# Patient Record
Sex: Male | Born: 1955 | Race: White | Hispanic: No | Marital: Married | Smoking: Current every day smoker
Health system: Southern US, Community
[De-identification: ages and names within clinical notes are randomized; demographics above are authoritative.]

## PROBLEM LIST (undated history)

## (undated) DIAGNOSIS — I10 Essential (primary) hypertension: Secondary | ICD-10-CM

## (undated) DIAGNOSIS — E7800 Pure hypercholesterolemia, unspecified: Secondary | ICD-10-CM

## (undated) DIAGNOSIS — E119 Type 2 diabetes mellitus without complications: Secondary | ICD-10-CM

## (undated) DIAGNOSIS — K219 Gastro-esophageal reflux disease without esophagitis: Secondary | ICD-10-CM

## (undated) DIAGNOSIS — K759 Inflammatory liver disease, unspecified: Secondary | ICD-10-CM

## (undated) DIAGNOSIS — E785 Hyperlipidemia, unspecified: Secondary | ICD-10-CM

## (undated) DIAGNOSIS — F419 Anxiety disorder, unspecified: Secondary | ICD-10-CM

## (undated) DIAGNOSIS — K7460 Unspecified cirrhosis of liver: Secondary | ICD-10-CM

## (undated) HISTORY — PX: EYE SURGERY: SHX253

## (undated) HISTORY — PX: ELBOW SURGERY: SHX618

---

## 2004-09-10 ENCOUNTER — Ambulatory Visit: Payer: Self-pay | Admitting: Family Medicine

## 2006-04-09 ENCOUNTER — Emergency Department: Payer: Self-pay | Admitting: Emergency Medicine

## 2011-11-04 DIAGNOSIS — Z720 Tobacco use: Secondary | ICD-10-CM | POA: Insufficient documentation

## 2011-11-04 DIAGNOSIS — R739 Hyperglycemia, unspecified: Secondary | ICD-10-CM | POA: Insufficient documentation

## 2011-11-04 DIAGNOSIS — B1920 Unspecified viral hepatitis C without hepatic coma: Secondary | ICD-10-CM | POA: Insufficient documentation

## 2011-11-04 DIAGNOSIS — H5440 Blindness, one eye, unspecified eye: Secondary | ICD-10-CM | POA: Insufficient documentation

## 2011-11-04 DIAGNOSIS — N529 Male erectile dysfunction, unspecified: Secondary | ICD-10-CM | POA: Insufficient documentation

## 2012-08-16 ENCOUNTER — Ambulatory Visit: Payer: Self-pay | Admitting: Gastroenterology

## 2012-08-16 LAB — CBC WITH DIFFERENTIAL/PLATELET
Basophil #: 0 10*3/uL (ref 0.0–0.1)
Basophil %: 0.7 %
Eosinophil %: 3.2 %
HGB: 14.6 g/dL (ref 13.0–18.0)
MCH: 33.3 pg (ref 26.0–34.0)
MCHC: 34.9 g/dL (ref 32.0–36.0)
Monocyte #: 0.6 x10 3/mm (ref 0.2–1.0)
Monocyte %: 11 %
Neutrophil %: 50.5 %
WBC: 5.9 10*3/uL (ref 3.8–10.6)

## 2012-08-16 LAB — HEPATIC FUNCTION PANEL A (ARMC)
Albumin: 4 g/dL (ref 3.4–5.0)
Alkaline Phosphatase: 90 U/L (ref 50–136)
Bilirubin, Direct: 0.1 mg/dL (ref 0.00–0.20)
SGOT(AST): 259 U/L — ABNORMAL HIGH (ref 15–37)
SGPT (ALT): 357 U/L — ABNORMAL HIGH (ref 12–78)

## 2012-10-19 ENCOUNTER — Ambulatory Visit: Payer: Self-pay | Admitting: Family Medicine

## 2013-03-26 ENCOUNTER — Emergency Department: Payer: Self-pay | Admitting: Emergency Medicine

## 2013-03-26 LAB — COMPREHENSIVE METABOLIC PANEL
ALK PHOS: 190 U/L — AB
ALT: 245 U/L — AB (ref 12–78)
ANION GAP: 13 (ref 7–16)
AST: 179 U/L — AB (ref 15–37)
Albumin: 3.5 g/dL (ref 3.4–5.0)
BUN: 18 mg/dL (ref 7–18)
Bilirubin,Total: 0.6 mg/dL (ref 0.2–1.0)
CREATININE: 0.93 mg/dL (ref 0.60–1.30)
Calcium, Total: 9.5 mg/dL (ref 8.5–10.1)
Chloride: 94 mmol/L — ABNORMAL LOW (ref 98–107)
Co2: 23 mmol/L (ref 21–32)
EGFR (Non-African Amer.): >60
GLUCOSE: 507 mg/dL — AB (ref 65–99)
Osmolality: 285 (ref 275–301)
Potassium: 4.6 mmol/L (ref 3.5–5.1)
Sodium: 130 mmol/L — ABNORMAL LOW (ref 136–145)
Total Protein: 8.6 g/dL — ABNORMAL HIGH (ref 6.4–8.2)

## 2013-03-26 LAB — CBC
HCT: 41.4 % (ref 40.0–52.0)
HGB: 14.8 g/dL (ref 13.0–18.0)
MCH: 33.7 pg (ref 26.0–34.0)
MCHC: 35.6 g/dL (ref 32.0–36.0)
MCV: 95 fL (ref 80–100)
Platelet: 99 10*3/uL — ABNORMAL LOW (ref 150–440)
RBC: 4.38 10*6/uL — AB (ref 4.40–5.90)
RDW: 12.4 % (ref 11.5–14.5)
WBC: 3.9 10*3/uL (ref 3.8–10.6)

## 2013-03-26 LAB — URINALYSIS, COMPLETE
BACTERIA: NONE SEEN
BILIRUBIN, UR: NEGATIVE
Blood: NEGATIVE
Glucose,UR: >=500 mg/dL (ref 0–75)
Leukocyte Esterase: NEGATIVE
Nitrite: NEGATIVE
Ph: 6 (ref 4.5–8.0)
Protein: NEGATIVE
RBC,UR: <1 /HPF (ref 0–5)
Specific Gravity: 1.025 (ref 1.003–1.030)
Squamous Epithelial: <1
WBC UR: <1 /HPF (ref 0–5)

## 2013-03-26 LAB — MAGNESIUM: MAGNESIUM: 1.8 mg/dL

## 2013-03-26 LAB — BETA-HYDROXYBUTYRIC ACID: Beta-Hydroxybutyrate: 19.5 mg/dL — ABNORMAL HIGH (ref 0.2–2.8)

## 2013-06-27 DIAGNOSIS — S62339A Displaced fracture of neck of unspecified metacarpal bone, initial encounter for closed fracture: Secondary | ICD-10-CM | POA: Insufficient documentation

## 2013-07-01 ENCOUNTER — Other Ambulatory Visit: Payer: Self-pay | Admitting: Urgent Care

## 2017-08-12 ENCOUNTER — Other Ambulatory Visit: Payer: Self-pay

## 2017-08-12 ENCOUNTER — Emergency Department
Admission: EM | Admit: 2017-08-12 | Discharge: 2017-08-13 | Disposition: A | Discharge status: Home / Self Care | Payer: Self-pay | Attending: Emergency Medicine | Admitting: Emergency Medicine

## 2017-08-12 ENCOUNTER — Encounter: Payer: Self-pay | Admitting: Emergency Medicine

## 2017-08-12 DIAGNOSIS — F17210 Nicotine dependence, cigarettes, uncomplicated: Secondary | ICD-10-CM | POA: Insufficient documentation

## 2017-08-12 DIAGNOSIS — Z9114 Patient's other noncompliance with medication regimen: Secondary | ICD-10-CM | POA: Insufficient documentation

## 2017-08-12 DIAGNOSIS — I10 Essential (primary) hypertension: Secondary | ICD-10-CM | POA: Insufficient documentation

## 2017-08-12 DIAGNOSIS — F419 Anxiety disorder, unspecified: Secondary | ICD-10-CM | POA: Insufficient documentation

## 2017-08-12 DIAGNOSIS — E1165 Type 2 diabetes mellitus with hyperglycemia: Secondary | ICD-10-CM | POA: Insufficient documentation

## 2017-08-12 DIAGNOSIS — Z9189 Other specified personal risk factors, not elsewhere classified: Secondary | ICD-10-CM

## 2017-08-12 HISTORY — DX: Pure hypercholesterolemia, unspecified: E78.00

## 2017-08-12 HISTORY — DX: Type 2 diabetes mellitus without complications: E11.9

## 2017-08-12 HISTORY — DX: Anxiety disorder, unspecified: F41.9

## 2017-08-12 HISTORY — DX: Essential (primary) hypertension: I10

## 2017-08-12 LAB — BASIC METABOLIC PANEL
ANION GAP: 11 (ref 5–15)
BUN: 34 mg/dL — ABNORMAL HIGH (ref 8–23)
CO2: 25 mmol/L (ref 22–32)
Calcium: 9.3 mg/dL (ref 8.9–10.3)
Chloride: 95 mmol/L — ABNORMAL LOW (ref 98–111)
Creatinine, Ser: 1.37 mg/dL — ABNORMAL HIGH (ref 0.61–1.24)
GFR calc Af Amer: >60 mL/min (ref >60)
GFR calc non Af Amer: 54 mL/min — ABNORMAL LOW (ref >60)
GLUCOSE: 418 mg/dL — AB (ref 70–99)
Potassium: 4.3 mmol/L (ref 3.5–5.1)
Sodium: 131 mmol/L — ABNORMAL LOW (ref 135–145)

## 2017-08-12 LAB — URINALYSIS, COMPLETE (UACMP) WITH MICROSCOPIC
BACTERIA UA: NONE SEEN
BILIRUBIN URINE: NEGATIVE
Glucose, UA: >=500 mg/dL — AB
KETONES UR: NEGATIVE mg/dL
Leukocytes, UA: NEGATIVE
Nitrite: NEGATIVE
PH: 5 (ref 5.0–8.0)
Protein, ur: NEGATIVE mg/dL
Specific Gravity, Urine: 1.024 (ref 1.005–1.030)

## 2017-08-12 LAB — BLOOD GAS, VENOUS
Acid-Base Excess: 1.7 mmol/L (ref 0.0–2.0)
Bicarbonate: 26.6 mmol/L (ref 20.0–28.0)
FIO2: 0.21
O2 Saturation: 84.6 %
PCO2 VEN: 42 mmHg — AB (ref 44.0–60.0)
PH VEN: 7.41 (ref 7.250–7.430)
Patient temperature: 37
pO2, Ven: 49 mmHg — ABNORMAL HIGH (ref 32.0–45.0)

## 2017-08-12 LAB — GLUCOSE, CAPILLARY
GLUCOSE-CAPILLARY: 366 mg/dL — AB (ref 70–99)
GLUCOSE-CAPILLARY: 379 mg/dL — AB (ref 70–99)
GLUCOSE-CAPILLARY: 338 mg/dL — AB (ref 70–99)
Glucose-Capillary: 405 mg/dL — ABNORMAL HIGH (ref 70–99)

## 2017-08-12 LAB — CBC
HEMATOCRIT: 40.8 % (ref 40.0–52.0)
HEMOGLOBIN: 14.5 g/dL (ref 13.0–18.0)
MCH: 33.5 pg (ref 26.0–34.0)
MCHC: 35.6 g/dL (ref 32.0–36.0)
MCV: 94.3 fL (ref 80.0–100.0)
Platelets: 112 10*3/uL — ABNORMAL LOW (ref 150–440)
RBC: 4.33 MIL/uL — ABNORMAL LOW (ref 4.40–5.90)
RDW: 13.4 % (ref 11.5–14.5)
WBC: 7 10*3/uL (ref 3.8–10.6)

## 2017-08-12 MED ORDER — METFORMIN HCL 500 MG PO TABS
1000.0000 mg | ORAL_TABLET | Freq: Once | ORAL | Status: AC
  Administered 2017-08-12: 1000 mg via ORAL
  Filled 2017-08-12: qty 2

## 2017-08-12 MED ORDER — SODIUM CHLORIDE 0.9 % IV BOLUS
1000.0000 mL | Freq: Once | INTRAVENOUS | Status: AC
  Administered 2017-08-12: 1000 mL via INTRAVENOUS

## 2017-08-12 MED ORDER — SODIUM CHLORIDE 0.9 % IV BOLUS
500.0000 mL | Freq: Once | INTRAVENOUS | Status: AC
  Administered 2017-08-12: 500 mL via INTRAVENOUS

## 2017-08-12 MED ORDER — INSULIN ASPART 100 UNIT/ML ~~LOC~~ SOLN
3.0000 [IU] | Freq: Once | SUBCUTANEOUS | Status: AC
  Administered 2017-08-12: 3 [IU] via SUBCUTANEOUS
  Filled 2017-08-12: qty 1

## 2017-08-12 NOTE — ED Notes (Signed)
Date and time results received: 08/12/17 2207  Test: Glucose Critical Value: 379 mg/dL  Name of Provider Notified: Dr. Jacqualine Code

## 2017-08-12 NOTE — ED Notes (Signed)
Pt ambulated to sub waiting area without difficulty; call bell in reach; visitor leaving at this time;

## 2017-08-12 NOTE — ED Notes (Signed)
Pt reports he has been out of his glipizide for about 2 weeks. Pt reports his blood sugar has been running a little high the last few days. Pt states he has been feeling a little weak. Pt denies other co's at this time.

## 2017-08-12 NOTE — ED Triage Notes (Signed)
C/O elevated blood sugar at home this afternoon.  States blood sugar has been elevated for "a couple of weeks".  States he has been feeling weak and having difficulty concentrating for a few weeks.  Also increased urination.

## 2017-08-12 NOTE — ED Provider Notes (Addendum)
[facility] Emergency Department Provider Note   ____________________________________________   First MD Initiated Contact with Patient 08/12/17 2140     (approximate)  I have reviewed the triage vital signs and the nursing notes.   HISTORY  Chief Complaint Hyperglycemia    HPI [name] is a 62 y.o. male history of diabetes hypertension  Patient reports for the last 2 weeks has been able to feel his blood sugars "high".  Reports that when he does he will occasionally take 1 of his metformin's.  He reports he does not take his medication very regularly, but takes it when he feels he needs it.  Is been feeling just slightly lightheaded, thirsty and urinating quite a bit the last 3 days.  He supposed to take metformin 1000 mg twice a day but reports he works third shift, does not remember to do it sometimes, and really like taking his medicine.  He also reports he was on glipizide, but stopped taking that a while ago because when he did take that it was causing his blood sugars to go too low.  No chest pain.  No fever chills.  No nausea vomiting.  He has not run out of his medication.  He has a primary care doctor.   Past Medical History:  Diagnosis Date  . Anxiety   . Diabetes mellitus without complication ([place])   . High cholesterol   . Hypertension     There are no active problems to display for this patient.   History reviewed. No pertinent surgical history.  Prior to Admission medications   Not on File    Allergies Patient has no known allergies.  No family history on file.  Social History Social History   Tobacco Use  . Smoking status: Current Every Day Smoker    Types: Cigarettes  . Smokeless tobacco: Never Used  Substance Use Topics  . Alcohol use: Not on file  . Drug use: Not on file  Denies illicit drug use  Review of Systems Constitutional: No fever/chills but is feeling a little lightheaded and thirsty Eyes: No  visual changes. ENT: No sore throat. Cardiovascular: Denies chest pain. Respiratory: Denies shortness of breath. Gastrointestinal: No abdominal pain.  No nausea, no vomiting.  No diarrhea.  No constipation. Genitourinary: Negative for dysuria.  Increased urination for the last 3 to 4 days. Musculoskeletal: Negative for back pain. Skin: Negative for rash. Neurological: Negative for headaches, focal weakness or numbness.    ____________________________________________   PHYSICAL EXAM:  VITAL SIGNS: ED Triage Vitals  Enc Vitals Group     BP 08/12/17 1647 128/75     Pulse Rate 08/12/17 1647 76     Resp 08/12/17 1647 16     Temp 08/12/17 1647 98.2 F (36.8 C)     Temp Source 08/12/17 1647 Oral     SpO2 08/12/17 1647 96 %     Weight 08/12/17 1648 200 lb (90.7 kg)     Height 08/12/17 1648 5' 10.5" (1.791 m)     Head Circumference --      Peak Flow --      Pain Score 08/12/17 1648 0     Pain Loc --      Pain Edu? --      Excl. in [place]? --     Constitutional: Alert and oriented. Well appearing and in no acute distress. Eyes: Conjunctivae are normal.  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 Head: Atraumatic.  Nose: No congestion/rhinnorhea. Mouth/Throat: Mucous membranes are slightly dry. Neck: No stridor.   Cardiovascular: Normal rate, regular rhythm. Grossly normal heart sounds.  Good peripheral circulation. Respiratory: Normal respiratory effort.  No retractions. Lungs CTAB. Gastrointestinal: Soft and nontender. No distention. Musculoskeletal: No lower extremity tenderness nor edema. Neurologic:  Normal speech and language. No gross focal neurologic deficits are appreciated.  Skin:  Skin is warm, dry and intact. No rash noted. Psychiatric: Mood and affect are normal. Speech and behavior are normal.  ____________________________________________   LABS (all labs ordered are listed, but only abnormal results are displayed)  Labs Reviewed  BASIC  METABOLIC PANEL - Abnormal; Notable for the following components:      Result Value   Sodium 131 (*)    Chloride 95 (*)    Glucose, Bld 418 (*)    BUN 34 (*)    Creatinine, Ser 1.37 (*)    GFR calc non Af Amer 54 (*)    All other components within normal limits  CBC - Abnormal; Notable for the following components:   RBC 4.33 (*)    Platelets 112 (*)    All other components within normal limits  URINALYSIS, COMPLETE (UACMP) WITH MICROSCOPIC - Abnormal; Notable for the following components:   Color, Urine YELLOW (*)    APPearance CLEAR (*)    Glucose, UA >=500 (*)    Hgb urine dipstick SMALL (*)    All other components within normal limits  GLUCOSE, CAPILLARY - Abnormal; Notable for the following components:   Glucose-Capillary 405 (*)    All other components within normal limits  BLOOD GAS, VENOUS - Abnormal; Notable for the following components:   pCO2, Ven 42 (*)    pO2, Ven 49.0 (*)    All other components within normal limits  GLUCOSE, CAPILLARY - Abnormal; Notable for the following components:   Glucose-Capillary 366 (*)    All other components within normal limits  GLUCOSE, CAPILLARY - Abnormal; Notable for the following components:   Glucose-Capillary 379 (*)    All other components within normal limits  GLUCOSE, CAPILLARY - Abnormal; Notable for the following components:   Glucose-Capillary 338 (*)    All other components within normal limits  CBG MONITORING, ED  CBG MONITORING, ED  CBG MONITORING, ED  CBG MONITORING, ED  CBG MONITORING, ED  CBG MONITORING, ED  CBG MONITORING, ED  CBG MONITORING, ED  CBG MONITORING, ED  CBG MONITORING, ED  CBG MONITORING, ED  CBG MONITORING, ED  CBG MONITORING, ED  CBG MONITORING, ED  CBG MONITORING, ED  CBG MONITORING, ED  CBG MONITORING, ED  CBG MONITORING, ED  CBG MONITORING, ED  CBG MONITORING, ED  CBG MONITORING, ED  CBG MONITORING, ED  CBG MONITORING, ED  CBG MONITORING, ED  CBG MONITORING, ED  CBG MONITORING,  ED  CBG MONITORING, ED  CBG MONITORING, ED   ____________________________________________  EKG   ____________________________________________  RADIOLOGY   ____________________________________________   PROCEDURES  Procedure(s) performed: None  Procedures  Critical Care performed: No  ____________________________________________   INITIAL IMPRESSION / ASSESSMENT AND PLAN / ED COURSE  Pertinent labs & imaging results that were available during my care of the patient were reviewed by me and considered in my medical decision making (see chart for details).  Patient presents for evaluation of elevated blood sugar.  Clinical examination very reassuring, no evidence of DKA.  In review of the patient's clinical history I suspect this is likely related to medication noncompliance.  He  appears to be poorly compliant with regard to using his medication for his diabetes.  He denies any infectious cardiac or acute neurologic symptoms.  Reassuring and normal hemodynamics.  Lab work very normal with exception to his elevated glucose slightly elevated creatinine.  Discussed with patient extensively, and he is self discontinued his sulfonylurea but highly noncompliant with his metformin.  Discussed with him, and will bring his blood sugar down with hydration, small dose of insulin and plan to discharge him once since improved but I discussed with both him and his wife and the patient is agreeable to setting reminders and checking with his wife to make sure that he is taking his medication as prescribed with regard to his metformin.  Because of his hypoglycemic episodes while taking his sulfonylurea, he would instead have encouraged him to take his metformin as prescribed, he will discontinue his sulfonylurea which she is not taking already and will call his primary care doctor Monday for close follow-up.  Educated he and his wife extensively on the need to monitor his blood sugars and medication  compliance importance.  Clinical Course as of Aug 13 12  Sat Aug 12, 2017  2321 Patient blood sugar elevated, currently 338 after liter of fluid.  I have given his metformin, will give a small dose of insulin at this time as he is insulin nave.  Patient appears stable, agreeable and understanding of plan for discharge once improvement in glucose, my goal at this point is to achieve a blood glucose around or less than 250.  Ongoing care assigned to Dr. Beather Arbour.  Follow-up on blood sugar check after insulin, anticipate discharge   [MQ]    Clinical Course User Index [MQ] Delman Kitten, MD     ____________________________________________   FINAL CLINICAL IMPRESSION(S) / ED DIAGNOSES  Final diagnoses:  Type 2 diabetes mellitus with hyperglycemia, without long-term current use of insulin ([place])  At risk for medication nonadherence      NEW MEDICATIONS STARTED DURING THIS VISIT:  New Prescriptions   No medications on file     Note:  This document was prepared using Dragon voice recognition software and may include unintentional dictation errors.     Delman Kitten, MD 08/13/17 0014   ----------------------------------------- 12:31 AM on 08/13/2017 -----------------------------------------  Patient has can be discharged, reports he feels much better and is ready to go.  Currently asymptomatic, fully awake and alert.  Not quite to the goal of where he wished to have his blood sugar at discharge as I discussed with him, but he reports he would like to go will take his medications as we have discussed and call his primary doctor Monday.  It appears improved, resting comfortably in no distress.  Wife driving him home.   Delman Kitten, MD 08/13/17 [phone]

## 2017-08-13 LAB — GLUCOSE, CAPILLARY: Glucose-Capillary: 325 mg/dL — ABNORMAL HIGH (ref 70–99)

## 2018-09-24 ENCOUNTER — Ambulatory Visit: Payer: Self-pay | Admitting: Gastroenterology

## 2018-09-24 ENCOUNTER — Encounter

## 2018-09-25 ENCOUNTER — Ambulatory Visit: Payer: Medicaid Other | Admitting: Gastroenterology

## 2018-09-25 ENCOUNTER — Other Ambulatory Visit: Payer: Self-pay

## 2018-09-25 ENCOUNTER — Encounter: Payer: Self-pay | Admitting: Gastroenterology

## 2018-09-25 ENCOUNTER — Telehealth: Payer: Self-pay

## 2018-09-25 VITALS — BP 123/63 | HR 78 | Temp 89.3°F | Ht 70.0 in | Wt 183.4 lb

## 2018-09-25 DIAGNOSIS — K7460 Unspecified cirrhosis of liver: Secondary | ICD-10-CM | POA: Diagnosis not present

## 2018-09-25 DIAGNOSIS — B1920 Unspecified viral hepatitis C without hepatic coma: Secondary | ICD-10-CM | POA: Diagnosis not present

## 2018-09-25 DIAGNOSIS — D696 Thrombocytopenia, unspecified: Secondary | ICD-10-CM

## 2018-09-25 NOTE — Telephone Encounter (Signed)
Contacted Kia at [facility] for her to fax patients most recent labs and medication list to [place].  Thanks [employer]

## 2018-09-25 NOTE — Progress Notes (Signed)
[name] [place]  [name], [name]  Main: [phone]  Fax: [phone]   Gastroenterology Consultation  Referring Provider:     Gennette Pac, FNP Primary Care Physician:  [name], [place] Reason for Consultation:     Hep C        HPI:    Chief Complaint  Patient presents with   New Patient (Initial Visit)    Hep C    [name] is a 63 y.o. y/o male referred for consultation & management  by Dr. Gennette Fry, [place].  Pt sent from PCP with history of Hep C, treatment naive. We have not received any official labs from PCP office.  Per referral note, history of hep C in 2017 and lost to follow-up.  "Labs from August 23, 2018 with RNA present, genotype 2B, with anemia with thrombocytopenia, platelet 88, INR 1.1, LFTs 2-3 upper limit of normal."  Patient also reports 1 year history of altered bowel habits, with small bowel movements and reports bilateral lower quadrant abdominal pain as well.  Started taking laxatives which helped the pain.  No blood in stool.  No weight loss.  No nausea or vomiting.  Reports intermittent dysphagia to liquids only, but not to solids.  No prior EGD or colonoscopy.  No family history of colon cancer.  Past Medical History:  Diagnosis Date   Anxiety    Diabetes mellitus without complication ([facility])    High cholesterol    Hypertension     History reviewed. No pertinent surgical history.  Prior to Admission medications   Medication Sig Start Date End Date Taking? Authorizing Provider  acetaminophen-codeine (TYLENOL #3) 300-30 MG tablet Take by mouth. 10/02/13   [provider]  sildenafil (VIAGRA) 100 MG tablet Take by mouth. 02/02/12   [provider]    History reviewed. No pertinent family history.   Social History   Tobacco Use   Smoking status: Current Every Day Smoker    Types: Cigarettes   Smokeless tobacco: Never Used  Substance Use Topics   Alcohol use: Not  on file   Drug use: Not on file    Allergies as of 09/25/2018   (No Known Allergies)    Review of Systems:    All systems reviewed and negative except where noted in HPI.   Physical Exam:  BP 123/63    Pulse 78    Temp (!) 89.3 F (31.8 C) (Oral)    Ht 5\' 10"  (1.778 m)    Wt 183 lb 6.4 oz (83.2 kg)    BMI 26.32 kg/m  No LMP for male patient. Psych:  Alert and cooperative. Normal mood and affect. General:   Alert,  Well-developed, well-nourished, pleasant and cooperative in NAD Head:  Normocephalic and atraumatic. Eyes:  Sclera clear, no icterus.   Conjunctiva pink. Ears:  Normal auditory acuity. Nose:  No deformity, discharge, or lesions. Mouth:  No deformity or lesions,oropharynx pink & moist. Neck:  Supple; no masses or thyromegaly. Abdomen:  Normal bowel sounds.  No bruits.  Soft, non-tender and non-distended without masses, hepatosplenomegaly or hernias noted.  No guarding or rebound tenderness.    Msk:  Symmetrical without gross deformities. Good, equal movement & strength bilaterally. Pulses:  Normal pulses noted. Extremities:  No clubbing or edema.  No cyanosis. Neurologic:  Alert and oriented x3;  grossly normal neurologically. Skin:  Intact without significant lesions or rashes. No jaundice. Lymph Nodes:  No significant cervical adenopathy.  Psych:  Alert and cooperative. Normal mood and affect.   Labs: CBC    Component Value Date/Time   WBC 7.0 08/12/2017 1650   RBC 4.33 (L) 08/12/2017 1650   HGB 14.5 08/12/2017 1650   HGB 14.8 03/26/2013 1203   HCT 40.8 08/12/2017 1650   HCT 41.4 03/26/2013 1203   PLT 112 (L) 08/12/2017 1650   PLT 99 (L) 03/26/2013 1203   MCV 94.3 08/12/2017 1650   MCV 95 03/26/2013 1203   MCH 33.5 08/12/2017 1650   MCHC 35.6 08/12/2017 1650   RDW 13.4 08/12/2017 1650   RDW 12.4 03/26/2013 1203   LYMPHSABS 2.0 08/16/2012 1704   MONOABS 0.6 08/16/2012 1704   EOSABS 0.2 08/16/2012 1704   BASOSABS 0.0 08/16/2012 1704   CMP       Component Value Date/Time   NA 131 (L) 08/12/2017 1650   NA 130 (L) 03/26/2013 1203   K 4.3 08/12/2017 1650   K 4.6 03/26/2013 1203   CL 95 (L) 08/12/2017 1650   CL 94 (L) 03/26/2013 1203   CO2 25 08/12/2017 1650   CO2 23 03/26/2013 1203   GLUCOSE 418 (H) 08/12/2017 1650   GLUCOSE 507 (HH) 03/26/2013 1203   BUN 34 (H) 08/12/2017 1650   BUN 18 03/26/2013 1203   CREATININE 1.37 (H) 08/12/2017 1650   CREATININE 0.93 03/26/2013 1203   CALCIUM 9.3 08/12/2017 1650   CALCIUM 9.5 03/26/2013 1203   PROT 8.6 (H) 03/26/2013 1203   ALBUMIN 3.5 03/26/2013 1203   AST 179 (H) 03/26/2013 1203   ALT 245 (H) 03/26/2013 1203   ALKPHOS 190 (H) 03/26/2013 1203   BILITOT 0.6 03/26/2013 1203   GFRNONAA 54 (L) 08/12/2017 1650   GFRNONAA >60 03/26/2013 1203   GFRAA >60 08/12/2017 1650   GFRAA >60 03/26/2013 1203    Imaging Studies: No results found.  Assessment and Plan:   [name] is a 63 y.o. y/o male has been referred for history of hepatitis C, treatment nave, reportedly diagnosed years ago, but lost to follow-up  We will try to obtain lab records from PCP office to see what was done and what needs to be repeated  Given his low platelet count, and previous ultrasounds, patient likely has underlying cirrhosis  Also drinks 12 pack beer on the weekends.  He was asked to abstain from alcohol as this can worsen cirrhosis.  He is also due for screening colonoscopy, and needs EGD for variceal screening and also for dysphagia  We will also order right upper quadrant ultrasound to reevaluate the liver  I have discussed alternative options, risks & benefits,  which include, but are not limited to, bleeding, infection, perforation,respiratory complication & drug reaction.  The patient agrees with this plan & written consent will be obtained.    Follow-up closely in clinic    Dr [name]  Speech recognition software was used to dictate the above note.

## 2018-09-25 NOTE — Patient Instructions (Signed)
Sharyn Lull will contact you this week to schedule you for your EGD w/Colonoscopy.  We will need to obtain labs from your PCP.  Follow up with Dr. Bonna Gains in 4-6 weeks.  Thank you. [facility] Staff

## 2018-09-26 ENCOUNTER — Other Ambulatory Visit: Payer: Self-pay

## 2018-09-26 ENCOUNTER — Telehealth: Payer: Self-pay

## 2018-09-26 ENCOUNTER — Encounter: Payer: Self-pay | Admitting: Family Medicine

## 2018-09-26 DIAGNOSIS — R1310 Dysphagia, unspecified: Secondary | ICD-10-CM

## 2018-09-26 DIAGNOSIS — Z1211 Encounter for screening for malignant neoplasm of colon: Secondary | ICD-10-CM

## 2018-09-26 NOTE — Telephone Encounter (Signed)
Patient has been scheduled for his colonoscopy w/egd with Dr. Bonna Gains on 10/04/18.  COVID test scheduled for 10/01/18.  RUQ U/S scheduled 10/03/18 at 08:30am pt instructed do not eat or drink after midnight.  Thanks [employer]

## 2018-09-27 ENCOUNTER — Telehealth: Payer: Self-pay

## 2018-09-27 NOTE — Telephone Encounter (Signed)
Had to move patient colonoscopy to 10/03/2018 because of  The scheduled. The Endo unit said we could not just have one patient scheduled for each day. Informed patient he needed to go for COVID testing tomorrow on 09/28/18 between 10:30 and 12:30. Patient verbalized understanding. Informed patient he would start a clear liquid diet on 10/02/18 and start the prep at 5pm that night. Patient verbalized understanding. Moved patient U/S to 10/05/18 at 8:00am

## 2018-09-27 NOTE — Addendum Note (Signed)
Addended by: Vanetta Mulders on: 09/27/2018 08:50 AM   Modules accepted: Orders

## 2018-09-28 ENCOUNTER — Other Ambulatory Visit
Admission: RE | Admit: 2018-09-28 | Discharge: 2018-09-28 | Disposition: A | Discharge status: Home / Self Care | Payer: Medicaid Other | Source: Ambulatory Visit | Attending: Gastroenterology | Admitting: Gastroenterology

## 2018-09-28 ENCOUNTER — Other Ambulatory Visit: Payer: Self-pay

## 2018-09-28 DIAGNOSIS — Z01812 Encounter for preprocedural laboratory examination: Secondary | ICD-10-CM | POA: Insufficient documentation

## 2018-09-28 DIAGNOSIS — Z20828 Contact with and (suspected) exposure to other viral communicable diseases: Secondary | ICD-10-CM | POA: Insufficient documentation

## 2018-09-28 LAB — SARS CORONAVIRUS 2 (TAT 6-24 HRS): SARS Coronavirus 2: NEGATIVE

## 2018-10-02 ENCOUNTER — Encounter: Payer: Self-pay | Admitting: *Deleted

## 2018-10-03 ENCOUNTER — Telehealth: Payer: Self-pay

## 2018-10-03 ENCOUNTER — Ambulatory Visit: Payer: Medicaid Other | Admitting: Anesthesiology

## 2018-10-03 ENCOUNTER — Encounter
Admission: RE | Disposition: A | Discharge status: Home / Self Care | Payer: Self-pay | Source: Home / Self Care | Attending: Gastroenterology

## 2018-10-03 ENCOUNTER — Ambulatory Visit: Payer: Medicaid Other

## 2018-10-03 ENCOUNTER — Ambulatory Visit
Admission: RE | Admit: 2018-10-03 | Discharge: 2018-10-03 | Disposition: A | Discharge status: Home / Self Care | Payer: Medicaid Other | Attending: Gastroenterology | Admitting: Gastroenterology

## 2018-10-03 ENCOUNTER — Other Ambulatory Visit: Payer: Self-pay

## 2018-10-03 DIAGNOSIS — K228 Other specified diseases of esophagus: Secondary | ICD-10-CM

## 2018-10-03 DIAGNOSIS — K7030 Alcoholic cirrhosis of liver without ascites: Secondary | ICD-10-CM

## 2018-10-03 DIAGNOSIS — Z79899 Other long term (current) drug therapy: Secondary | ICD-10-CM | POA: Diagnosis not present

## 2018-10-03 DIAGNOSIS — F17210 Nicotine dependence, cigarettes, uncomplicated: Secondary | ICD-10-CM | POA: Insufficient documentation

## 2018-10-03 DIAGNOSIS — K3189 Other diseases of stomach and duodenum: Secondary | ICD-10-CM | POA: Diagnosis not present

## 2018-10-03 DIAGNOSIS — E7800 Pure hypercholesterolemia, unspecified: Secondary | ICD-10-CM | POA: Insufficient documentation

## 2018-10-03 DIAGNOSIS — K2289 Other specified disease of esophagus: Secondary | ICD-10-CM

## 2018-10-03 DIAGNOSIS — E119 Type 2 diabetes mellitus without complications: Secondary | ICD-10-CM | POA: Diagnosis not present

## 2018-10-03 DIAGNOSIS — F419 Anxiety disorder, unspecified: Secondary | ICD-10-CM | POA: Insufficient documentation

## 2018-10-03 DIAGNOSIS — I8510 Secondary esophageal varices without bleeding: Secondary | ICD-10-CM | POA: Diagnosis not present

## 2018-10-03 DIAGNOSIS — R1310 Dysphagia, unspecified: Secondary | ICD-10-CM | POA: Diagnosis not present

## 2018-10-03 DIAGNOSIS — K5730 Diverticulosis of large intestine without perforation or abscess without bleeding: Secondary | ICD-10-CM | POA: Diagnosis not present

## 2018-10-03 DIAGNOSIS — K219 Gastro-esophageal reflux disease without esophagitis: Secondary | ICD-10-CM | POA: Insufficient documentation

## 2018-10-03 DIAGNOSIS — K766 Portal hypertension: Secondary | ICD-10-CM

## 2018-10-03 DIAGNOSIS — I10 Essential (primary) hypertension: Secondary | ICD-10-CM | POA: Diagnosis not present

## 2018-10-03 DIAGNOSIS — B1920 Unspecified viral hepatitis C without hepatic coma: Secondary | ICD-10-CM

## 2018-10-03 DIAGNOSIS — B3781 Candidal esophagitis: Secondary | ICD-10-CM | POA: Diagnosis not present

## 2018-10-03 DIAGNOSIS — K7460 Unspecified cirrhosis of liver: Secondary | ICD-10-CM | POA: Diagnosis not present

## 2018-10-03 DIAGNOSIS — K5520 Angiodysplasia of colon without hemorrhage: Secondary | ICD-10-CM | POA: Diagnosis not present

## 2018-10-03 DIAGNOSIS — Z1211 Encounter for screening for malignant neoplasm of colon: Secondary | ICD-10-CM

## 2018-10-03 DIAGNOSIS — Z7984 Long term (current) use of oral hypoglycemic drugs: Secondary | ICD-10-CM | POA: Diagnosis not present

## 2018-10-03 HISTORY — PX: ESOPHAGOGASTRODUODENOSCOPY (EGD) WITH PROPOFOL: SHX5813

## 2018-10-03 HISTORY — PX: COLONOSCOPY WITH PROPOFOL: SHX5780

## 2018-10-03 HISTORY — DX: Gastro-esophageal reflux disease without esophagitis: K21.9

## 2018-10-03 LAB — KOH PREP

## 2018-10-03 LAB — GLUCOSE, CAPILLARY: Glucose-Capillary: 201 mg/dL — ABNORMAL HIGH (ref 70–99)

## 2018-10-03 SURGERY — COLONOSCOPY WITH PROPOFOL
Anesthesia: General

## 2018-10-03 MED ORDER — LIDOCAINE HCL (CARDIAC) PF 100 MG/5ML IV SOSY
PREFILLED_SYRINGE | INTRAVENOUS | Status: DC | PRN
  Administered 2018-10-03: 20 mg via INTRAVENOUS

## 2018-10-03 MED ORDER — PROPOFOL 10 MG/ML IV BOLUS
INTRAVENOUS | Status: DC | PRN
  Administered 2018-10-03: 20 mg via INTRAVENOUS
  Administered 2018-10-03: 30 mg via INTRAVENOUS
  Administered 2018-10-03: 50 mg via INTRAVENOUS

## 2018-10-03 MED ORDER — PROPOFOL 500 MG/50ML IV EMUL
INTRAVENOUS | Status: DC | PRN
  Administered 2018-10-03: 180 ug/kg/min via INTRAVENOUS

## 2018-10-03 MED ORDER — PROPOFOL 10 MG/ML IV BOLUS
INTRAVENOUS | Status: AC
  Filled 2018-10-03: qty 40

## 2018-10-03 MED ORDER — SODIUM CHLORIDE 0.9 % IV SOLN
INTRAVENOUS | Status: DC
  Administered 2018-10-03 (×2): via INTRAVENOUS

## 2018-10-03 MED ORDER — PROPOFOL 500 MG/50ML IV EMUL
INTRAVENOUS | Status: AC
  Filled 2018-10-03: qty 50

## 2018-10-03 MED ORDER — EPHEDRINE SULFATE 50 MG/ML IJ SOLN
INTRAMUSCULAR | Status: DC | PRN
  Administered 2018-10-03: 10 mg via INTRAVENOUS

## 2018-10-03 NOTE — Op Note (Signed)
[facility] Gastroenterology Patient Name: [name] Procedure Date: 10/03/2018 9:47 AM MRN: [id number] Account #: [id number] Date of Birth: 1955/09/19 Admit Type: Outpatient Age: 63 Room: [facility] ENDO ROOM 3 Gender: Male Note Status: Finalized Procedure:            Upper GI endoscopy Indications:          Cirrhosis rule out esophageal varices Providers:            Gatha Mcnulty B. Bonna Gains MD, MD Referring MD:         Forest Gleason Md, MD (Referring MD) Medicines:            Monitored Anesthesia Care Complications:        No immediate complications. Procedure:            Pre-Anesthesia Assessment:                       - The risks and benefits of the procedure and the                        sedation options and risks were discussed with the                        patient. All questions were answered and informed                        consent was obtained.                       - Patient identification and proposed procedure were                        verified prior to the procedure.                       - ASA Grade Assessment: III - A patient with severe                        systemic disease.                       After obtaining informed consent, the endoscope was                        passed under direct vision. Throughout the procedure,                        the patient's blood pressure, pulse, and oxygen                        saturations were monitored continuously. The Endoscope                        was introduced through the mouth, and advanced to the                        second part of duodenum. The upper GI endoscopy was                        accomplished with ease. The patient tolerated the  procedure well. Findings:      Three columns of non-bleeding grade I varices were found in the distal       esophagus,. No stigmata of recent bleeding were evident and no red wale       signs were present.      White nummular lesions were  noted in the mid esophagus. Brushings for       KOH prep were obtained.      Moderate portal hypertensive gastropathy was found in the gastric fundus       and in the gastric body. Three biopsies were obtained in the gastric       body, at the incisura and in the gastric antrum with cold forceps for       histology.      Patchy mildly erythematous mucosa without bleeding was found in the       gastric antrum.      The examined duodenum was normal. Impression:           - Non-bleeding grade I esophageal varices.                       - White nummular lesions in esophageal mucosa.                        Brushings performed.                       - Portal hypertensive gastropathy.                       - Erythematous mucosa in the antrum.                       - Normal examined duodenum.                       - Three biopsies were obtained in the gastric body, at                        the incisura and in the gastric antrum. Recommendation:       - Await pathology results.                       - Return to my office in 1 week.                       - Continue present medications.                       - Pt was advised to abstain from alcohol use                       beta blockers to be discussed in clinic                       - The findings and recommendations were discussed with                        the patient. Procedure Code(s):    --- Professional ---                       [id number], Esophagogastroduodenoscopy, flexible, transoral;  with biopsy, single or multiple Diagnosis Code(s):    --- Professional ---                       K74.60, Unspecified cirrhosis of liver                       I85.10, Secondary esophageal varices without bleeding                       K22.8, Other specified diseases of esophagus                       K76.6, Portal hypertension                       K31.89, Other diseases of stomach and duodenum CPT copyright 2019 American Medical  Association. All rights reserved. The codes documented in this report are preliminary and upon coder review may  be revised to meet current compliance requirements.  [name], MD Margretta Sidle B. Bonna Gains MD, MD 10/03/2018 10:13:53 AM This report has been signed electronically. Number of Addenda: 0 Note Initiated On: 10/03/2018 9:47 AM Estimated Blood Loss: Estimated blood loss: none.      [facility]

## 2018-10-03 NOTE — H&P (Signed)
Kirk Antigua, MD [address], [place], [place], [place], [address] [address], [place], [place], [place], [address] Phone: [phone]  Fax: [phone]  Primary Care Physician:  Kirk Pac, FNP   Pre-Procedure History & Physical: HPI:  [name] is a 63 y.o. male is here for a colonoscopy and EGD.   Past Medical History:  Diagnosis Date  . Anxiety   . Diabetes mellitus without complication ([facility])   . GERD (gastroesophageal reflux disease)   . High cholesterol   . Hypertension     Past Surgical History:  Procedure Laterality Date  . ELBOW SURGERY      Prior to Admission medications   Medication Sig Start Date End Date Taking? Authorizing Provider  sildenafil (VIAGRA) 100 MG tablet Take by mouth. 02/02/12  Yes [provider]  albuterol (VENTOLIN HFA) 108 (90 Base) MCG/ACT inhaler Inhale 2 puffs into the lungs every 6 (six) hours as needed for wheezing or shortness of breath (every 4 to 6 hours as needed).    [provider]  atorvastatin (LIPITOR) 40 MG tablet Take 40 mg by mouth daily.    [provider]  canagliflozin (INVOKANA) 100 MG TABS tablet Take 100 mg by mouth daily before breakfast.    [provider]  lisinopril (ZESTRIL) 20 MG tablet Take 20 mg by mouth daily.    [provider]  metFORMIN (GLUCOPHAGE) 1000 MG tablet Take 1,000 mg by mouth 2 (two) times daily with a meal.    [provider]    Allergies as of 09/26/2018  . (No Known Allergies)    History reviewed. No pertinent family history.  Social History   Socioeconomic History  . Marital status: Single    Spouse name: Not on file  . Number of children: Not on file  . Years of education: Not on file  . Highest education level: Not on file  Occupational History  . Not on file  Social Needs  . Financial resource strain: Not on file  . Food insecurity    Worry: Not on file    Inability: Not on file  . Transportation needs     Medical: Not on file    Non-medical: Not on file  Tobacco Use  . Smoking status: Current Every Day Smoker    Types: Cigarettes  . Smokeless tobacco: Never Used  Substance and Sexual Activity  . Alcohol use: Never    Frequency: Never  . Drug use: Never  . Sexual activity: Not on file  Lifestyle  . Physical activity    Days per week: Not on file    Minutes per session: Not on file  . Stress: Not on file  Relationships  . Social Herbalist on phone: Not on file    Gets together: Not on file    Attends religious service: Not on file    Active member of club or organization: Not on file    Attends meetings of clubs or organizations: Not on file    Relationship status: Not on file  . Intimate partner violence    Fear of current or ex partner: Not on file    Emotionally abused: Not on file    Physically abused: Not on file    Forced sexual activity: Not on file  Other Topics Concern  . Not on file  Social History Narrative  . Not on file    Review of Systems: See HPI, otherwise negative ROS  Physical Exam:  BP (!) 138/93   Pulse 93   Temp 99.5 F (37.5 C) (Oral)   Resp 18   Ht 5\' 10"  (1.778 m)   Wt 83 kg   SpO2 95%   BMI 26.26 kg/m  General:   Alert,  pleasant and cooperative in NAD Head:  Normocephalic and atraumatic. Neck:  Supple; no masses or thyromegaly. Lungs:  Clear throughout to auscultation, normal respiratory effort.    Heart:  +S1, +S2, Regular rate and rhythm, No edema. Abdomen:  Soft, nontender and nondistended. Normal bowel sounds, without guarding, and without rebound.   Neurologic:  Alert and  oriented x4;  grossly normal neurologically.  Impression/Plan: [name] is here for a colonoscopy to be performed for average risk screening and EGD for variceal screening.  Risks, benefits, limitations, and alternatives regarding the procedures have been reviewed with the patient.  Questions have been answered.  All parties agreeable.    Virgel Manifold, MD  10/03/2018, 9:42 AM

## 2018-10-03 NOTE — Transfer of Care (Signed)
Immediate Anesthesia Transfer of Care Note  Patient: [name]  Procedure(s) Performed: COLONOSCOPY WITH PROPOFOL (N/A ) ESOPHAGOGASTRODUODENOSCOPY (EGD) WITH PROPOFOL (N/A )  Patient Location: PACU  Anesthesia Type:General  Level of Consciousness: awake and alert   Airway & Oxygen Therapy: Patient Spontanous Breathing and Patient connected to nasal cannula oxygen  Post-op Assessment: Report given to RN and Post -op Vital signs reviewed and stable  Post vital signs: Reviewed and stable  Last Vitals:  Vitals Value Taken Time  BP    Temp    Pulse    Resp    SpO2      Last Pain:  Vitals:   10/03/18 0907  TempSrc: Oral  PainSc: 0-No pain         Complications: No apparent anesthesia complications

## 2018-10-03 NOTE — Anesthesia Post-op Follow-up Note (Signed)
Anesthesia QCDR form completed.        

## 2018-10-03 NOTE — Telephone Encounter (Signed)
Contacted pt and rescheduled RUQ [place] to Tuesday, Sept 8th. Added elastography.

## 2018-10-03 NOTE — Telephone Encounter (Signed)
Can you please help me with this?

## 2018-10-03 NOTE — Telephone Encounter (Signed)
-----   Message from Virgel Manifold, MD sent at 10/03/2018  9:42 AM EDT ----- He has an Ultrasound scheduled in 2 days. But he has Hep C, and we now have his labs from his PCP (see scanned report from June), and can start him on medication for Hep C. Ginger works on [employer]. Can you ask her if he would need his Ultrasound to be with elastography to get approved for treatment and change it accordingly.

## 2018-10-03 NOTE — Anesthesia Preprocedure Evaluation (Addendum)
Anesthesia Evaluation  Patient identified by MRN, date of birth, ID band Patient awake    Reviewed: Allergy & Precautions, H&P , NPO status , Patient's Chart, lab work & pertinent test results  Airway Mallampati: III  TM Distance: >3 FB     Dental   Pulmonary Current Smoker,           Cardiovascular hypertension,      Neuro/Psych PSYCHIATRIC DISORDERS Anxiety negative neurological ROS     GI/Hepatic GERD  Controlled,(+) Hepatitis -, C  Endo/Other  diabetes  Renal/GU negative Renal ROS  negative genitourinary   Musculoskeletal   Abdominal   Peds  Hematology negative hematology ROS (+)   Anesthesia Other Findings Past Medical History: No date: Anxiety No date: Diabetes mellitus without complication ([facility]) No date: High cholesterol No date: Hypertension  History reviewed. No pertinent surgical history.     Reproductive/Obstetrics negative OB ROS                            Anesthesia Physical Anesthesia Plan  ASA: III  Anesthesia Plan: General   Post-op Pain Management:    Induction:   PONV Risk Score and Plan: Propofol infusion and TIVA  Airway Management Planned: Natural Airway and Nasal Cannula  Additional Equipment:   Intra-op Plan:   Post-operative Plan:   Informed Consent: I have reviewed the patients History and Physical, chart, labs and discussed the procedure including the risks, benefits and alternatives for the proposed anesthesia with the patient or authorized representative who has indicated his/her understanding and acceptance.     Dental Advisory Given  Plan Discussed with: Anesthesiologist and CRNA  Anesthesia Plan Comments:         Anesthesia Quick Evaluation

## 2018-10-03 NOTE — Op Note (Signed)
[facility] Patient Name: [name] Procedure Date: 10/03/2018 9:44 AM MRN: [id number] Account #: [id number] Date of Birth: Dec 19, 1955 Admit Type: Outpatient Age: 63 Room: [address] [facility] ENDO ROOM 3 Gender: Male Note Status: Finalized Procedure:            Colonoscopy Indications:          Screening for colorectal malignant neoplasm Providers:            Kerney Hopfensperger B. Bonna Gains MD, MD Referring MD:         Forest Gleason Md, MD (Referring MD) Medicines:            Monitored Anesthesia Care Complications:        No immediate complications. Procedure:            Pre-Anesthesia Assessment:                       - Prior to the procedure, a History and Physical was                        performed, and patient medications, allergies and                        sensitivities were reviewed. The patient's tolerance of                        previous anesthesia was reviewed.                       - The risks and benefits of the procedure and the                        sedation options and risks were discussed with the                        patient. All questions were answered and informed                        consent was obtained.                       - Patient identification and proposed procedure were                        verified prior to the procedure by the physician, the                        nurse, the anesthetist and the technician. The                        procedure was verified in the pre-procedure area in the                        procedure room in the endoscopy suite.                       - ASA Grade Assessment: III - A patient with severe                        systemic disease.                       -  After reviewing the risks and benefits, the patient                        was deemed in satisfactory condition to undergo the                        procedure.                       After obtaining informed consent, the colonoscope was            passed under direct vision. Throughout the procedure,                        the patient's blood pressure, pulse, and oxygen                        saturations were monitored continuously. The                        Colonoscope was introduced through the anus and                        advanced to the the cecum, identified by appendiceal                        orifice and ileocecal valve. The colonoscopy was                        performed with ease. The patient tolerated the                        procedure well. The quality of the bowel preparation                        was fair. Findings:      The perianal and digital rectal examinations were normal.      Multiple small angioectasias without bleeding were found in the       transverse colon and in the ascending colon.      A few diverticula were found in the sigmoid colon.      The exam was otherwise without abnormality.      The rectum, sigmoid colon, descending colon, transverse colon, ascending       colon and cecum appeared normal.      The retroflexed view of the distal rectum and anal verge was normal and       showed no anal or rectal abnormalities. Impression:           - Preparation of the colon was fair.                       - Multiple non-bleeding colonic angioectasias.                       - Diverticulosis in the sigmoid colon.                       - The examination was otherwise normal.                       - The rectum, sigmoid colon, descending colon,  transverse colon, ascending colon and cecum are normal.                       - The distal rectum and anal verge are normal on                        retroflexion view.                       - No specimens collected. Recommendation:       - Discharge patient to home.                       - Resume previous diet.                       - Continue present medications.                       - Repeat colonoscopy in 5 years, with 2 day  prep for                        screening purposes.                       - Return to primary care physician as previously                        scheduled.                       - The findings and recommendations were discussed with                        the patient.                       - The findings and recommendations were discussed with                        the patient's family.                       - High fiber diet. Procedure Code(s):    --- Professional ---                       [phone], Colonoscopy, flexible; diagnostic, including                        collection of specimen(s) by brushing or washing, when                        performed (separate procedure) Diagnosis Code(s):    --- Professional ---                       Z12.11, Encounter for screening for malignant neoplasm                        of colon                       K55.20, Angiodysplasia of colon without hemorrhage  K57.30, Diverticulosis of large intestine without                        perforation or abscess without bleeding CPT copyright 2019 American Medical Association. All rights reserved. The codes documented in this report are preliminary and upon coder review may  be revised to meet current compliance requirements.  [name], MD Margretta Sidle B. Bonna Gains MD, MD 10/03/2018 10:47:40 AM This report has been signed electronically. Number of Addenda: 0 Note Initiated On: 10/03/2018 9:44 AM Scope Withdrawal Time: 0 hours 16 minutes 8 seconds  Total Procedure Duration: 0 hours 24 minutes 52 seconds       [facility]

## 2018-10-04 ENCOUNTER — Encounter: Payer: Self-pay | Admitting: Gastroenterology

## 2018-10-04 LAB — SURGICAL PATHOLOGY

## 2018-10-05 ENCOUNTER — Other Ambulatory Visit: Payer: Self-pay | Admitting: Gastroenterology

## 2018-10-05 ENCOUNTER — Ambulatory Visit: Payer: Medicaid Other

## 2018-10-05 MED ORDER — FLUCONAZOLE 200 MG PO TABS: ORAL_TABLET | ORAL | 0 refills | Status: DC

## 2018-10-05 NOTE — Anesthesia Postprocedure Evaluation (Signed)
Anesthesia Post Note  Patient: [name]  Procedure(s) Performed: COLONOSCOPY WITH PROPOFOL (N/A ) ESOPHAGOGASTRODUODENOSCOPY (EGD) WITH PROPOFOL (N/A )  Patient location during evaluation: PACU Anesthesia Type: General Level of consciousness: awake and alert Pain management: pain level controlled Vital Signs Assessment: post-procedure vital signs reviewed and stable Respiratory status: spontaneous breathing, nonlabored ventilation and respiratory function stable Cardiovascular status: blood pressure returned to baseline and stable Postop Assessment: no apparent nausea or vomiting Anesthetic complications: no     Last Vitals:  Vitals:   10/03/18 1106 10/03/18 1116  BP: 125/83 121/78  Pulse: 91 87  Resp: (!) 26 (!) 22  Temp:    SpO2: 92% 96%    Last Pain:  Vitals:   10/04/18 0745  TempSrc:   PainSc: 0-No pain                 Durenda Hurt

## 2018-10-09 ENCOUNTER — Other Ambulatory Visit: Payer: Self-pay

## 2018-10-09 ENCOUNTER — Telehealth: Payer: Self-pay

## 2018-10-09 ENCOUNTER — Ambulatory Visit
Admission: RE | Admit: 2018-10-09 | Discharge: 2018-10-09 | Disposition: A | Discharge status: Home / Self Care | Payer: Medicaid Other | Source: Ambulatory Visit | Attending: Gastroenterology | Admitting: Gastroenterology

## 2018-10-09 DIAGNOSIS — B1920 Unspecified viral hepatitis C without hepatic coma: Secondary | ICD-10-CM

## 2018-10-09 MED ORDER — FLUCONAZOLE 200 MG PO TABS: ORAL_TABLET | ORAL | 0 refills | Status: DC

## 2018-10-09 MED ORDER — FLUCONAZOLE 200 MG PO TABS: ORAL_TABLET | ORAL | 0 refills | Status: AC

## 2018-10-09 NOTE — Telephone Encounter (Signed)
Patient states he would like this sent to [facility] on [address]. Patient verbalized understanding of results

## 2018-10-09 NOTE — Telephone Encounter (Signed)
Called and left a message for call back  

## 2018-10-09 NOTE — Telephone Encounter (Signed)
-----   Message from Virgel Manifold, MD sent at 10/09/2018  2:13 PM EDT ----- Caryl Pina please let the patient know, the sampling from his esophagus showed yeast.  I have sent a medication over to his pharmacy to treat this.

## 2018-10-11 ENCOUNTER — Other Ambulatory Visit: Payer: Self-pay

## 2018-10-15 ENCOUNTER — Other Ambulatory Visit: Payer: Self-pay

## 2018-10-15 ENCOUNTER — Encounter: Payer: Self-pay | Admitting: Gastroenterology

## 2018-10-15 ENCOUNTER — Ambulatory Visit (INDEPENDENT_AMBULATORY_CARE_PROVIDER_SITE_OTHER): Payer: Medicaid Other | Admitting: Gastroenterology

## 2018-10-15 DIAGNOSIS — R1084 Generalized abdominal pain: Secondary | ICD-10-CM

## 2018-10-15 DIAGNOSIS — K7030 Alcoholic cirrhosis of liver without ascites: Secondary | ICD-10-CM | POA: Diagnosis not present

## 2018-10-15 MED ORDER — CARVEDILOL 6.25 MG PO TABS: 6.2500 mg | ORAL_TABLET | Freq: Every day | ORAL | 1 refills | Status: DC

## 2018-10-15 MED ORDER — METAMUCIL 0.36 G PO CAPS: 1.0000 | ORAL_CAPSULE | Freq: Every day | ORAL | 1 refills | Status: AC

## 2018-10-15 NOTE — Patient Instructions (Addendum)
Please take Metamucil Daily and this medication was sent to your pharmacy   Your CT abdominal and pelvis w contrast is scheduled for Friday at [facility] in [place]  on 10/19/2018 at 10:15am

## 2018-10-15 NOTE — Progress Notes (Signed)
[name], MD [address]  [place]  [place], [place] [address]  Main: [phone]  Fax: [phone]   Primary Care Physician: Gennette Pac, FNP   Chief Complaint  Patient presents with  . Cirrhosis    Patient has had lower abdominal pain that is sharp. Patient has had nausea and diarrhea. Patient states on thursday he was in really bad pain that almost went to the ER   . Hepatitis C    HPI: [name] is a 63 y.o. male here for follow-up of hepatitis C and underlying cirrhosis..  Patient presents with his wife today.  Reports diffuse abdominal pain for 2 to 3 months.  Reports having to go to the bathroom 10 times a day but does not move his bowels or has any output all 10 times.  Has a loose bowel movement about 5 times goes 10 times that he goes to the bathroom.  No blood in stool.  Does not use anything to help him go to the bathroom.  No nausea or vomiting.  When initially referred for hepatitis C in August 2020, it was noted that he had low platelets, and he drinks 12 pack of beer every weekend.  He was advised to abstain from this as cirrhosis suspected.  Referral states he was previously diagnosed with hepatitis C 2017 and was lost to follow-up.  Labs from PCP office obtained and show her anemia approximately 800,000, genotype 2B, low platelets around 88, INR 1.1, elevated transaminases in the 100s.  EGD September 2020 showed grade 1 esophageal varices, nonbleeding, 3 columns KOH prep showed yeast and fluconazole as prescribed Portal hypertensive gastropathy noted.  Colonoscopy showed fair prep.  Nonbleeding AVMs.  Diverticulosis.  DIAGNOSIS:  A. 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  - SUPERFICIAL VASCULAR CONGESTION WITH ENDOSCOPIC IMPRESSION OF PORTAL  HYPERTENSIVE GASTROPATHY.  - NEGATIVE FOR H. PYLORI, DYSPLASIA, AND MALIGNANCY.   Right  upper quadrant ultrasound, September 2020, with nodular hepatic contour of the liver.  No focal mass.  Elastography with fibrosis score F2 + F3.  Current Outpatient Medications  Medication Sig Dispense Refill  . atorvastatin (LIPITOR) 40 MG tablet Take 40 mg by mouth daily.    . canagliflozin (INVOKANA) 100 MG TABS tablet Take 100 mg by mouth daily before breakfast.    . fluconazole (DIFLUCAN) 200 MG tablet Take 2 tablets (400 mg total) by mouth daily for 1 day, THEN 1 tablet (200 mg total) daily for 14 days. 16 tablet 0  . lisinopril (ZESTRIL) 20 MG tablet Take 20 mg by mouth daily.    . metFORMIN (GLUCOPHAGE) 1000 MG tablet Take 1,000 mg by mouth 2 (two) times daily with a meal.    . sildenafil (VIAGRA) 100 MG tablet Take by mouth.    . carvedilol (COREG) 6.25 MG tablet Take 1 tablet (6.25 mg total) by mouth daily. 30 tablet 1  . Psyllium (METAMUCIL) 0.36 g CAPS Take 1 tablet by mouth daily. 30 capsule 1   No current facility-administered medications for this visit.     Allergies as of 10/15/2018  . (No Known Allergies)    ROS:  General: Negative for anorexia, weight loss, fever, chills, fatigue, weakness. ENT: Negative for hoarseness, difficulty swallowing , nasal congestion. CV: Negative for chest pain, angina, palpitations, dyspnea on exertion, peripheral edema.  Respiratory: Negative for dyspnea at rest, dyspnea on exertion, cough, sputum, wheezing.  GI: See history of present illness. GU:  Negative for dysuria, hematuria, urinary incontinence, urinary frequency, nocturnal urination.  Endo: Negative for unusual weight change.    Physical Examination:   BP (!) 142/79 (BP Location: Left Arm, Patient Position: Sitting, Cuff Size: Normal)   Pulse 89   Temp 97.7 F (36.5 C) (Tympanic)   Ht 5\' 10"  (1.778 m)   Wt 187 lb 2 oz (84.9 kg)   BMI 26.85 kg/m   General: Well-nourished, well-developed in no acute distress.  Eyes: No icterus. Conjunctivae pink. Mouth: Oropharyngeal  mucosa moist and pink , no lesions erythema or exudate. Neck: Supple, Trachea midline Abdomen: Bowel sounds are normal, nontender, nondistended, no hepatosplenomegaly or masses, no abdominal bruits or hernia , no rebound or guarding.   Extremities: No lower extremity edema. No clubbing or deformities. Neuro: Alert and oriented x 3.  Grossly intact. Skin: Warm and dry, no jaundice.   Psych: Alert and cooperative, normal mood and affect.   Labs: CMP     Component Value Date/Time   NA 131 (L) 08/12/2017 1650   NA 130 (L) 03/26/2013 1203   K 4.3 08/12/2017 1650   K 4.6 03/26/2013 1203   CL 95 (L) 08/12/2017 1650   CL 94 (L) 03/26/2013 1203   CO2 25 08/12/2017 1650   CO2 23 03/26/2013 1203   GLUCOSE 418 (H) 08/12/2017 1650   GLUCOSE 507 (HH) 03/26/2013 1203   BUN 34 (H) 08/12/2017 1650   BUN 18 03/26/2013 1203   CREATININE 1.37 (H) 08/12/2017 1650   CREATININE 0.93 03/26/2013 1203   CALCIUM 9.3 08/12/2017 1650   CALCIUM 9.5 03/26/2013 1203   PROT 8.6 (H) 03/26/2013 1203   ALBUMIN 3.5 03/26/2013 1203   AST 179 (H) 03/26/2013 1203   ALT 245 (H) 03/26/2013 1203   ALKPHOS 190 (H) 03/26/2013 1203   BILITOT 0.6 03/26/2013 1203   GFRNONAA 54 (L) 08/12/2017 1650   GFRNONAA >60 03/26/2013 1203   GFRAA >60 08/12/2017 1650   GFRAA >60 03/26/2013 1203   Lab Results  Component Value Date   WBC 7.0 08/12/2017   HGB 14.5 08/12/2017   HCT 40.8 08/12/2017   MCV 94.3 08/12/2017   PLT 112 (L) 08/12/2017    Imaging Studies: US Abdomen Ruq W/elastography  Result Date: 10/09/2018 CLINICAL DATA:  Hepatitis c EXAM: US ABDOMEN LIMITED - RIGHT UPPER QUADRANT ULTRASOUND HEPATIC ELASTOGRAPHY TECHNIQUE: Limited right upper quadrant abdominal ultrasound was performed. In addition, ultrasound elastography evaluation of the liver was performed. A region of interest was placed in the right lobe of the liver. Following application of a compressive sonographic pulse, shear waves were detected in the  adjacent hepatic tissue and the shear wave velocity was calculated. Multiple assessments were performed at the selected site. Median shear wave velocity is correlated to a Metavir fibrosis score. COMPARISON:  09/10/2004 FINDINGS: ULTRASOUND ABDOMEN LIMITED RIGHT UPPER QUADRANT Gallbladder: No gallstones or wall thickening visualized. No sonographic Murphy sign noted. Common bile duct: Diameter: 0.4 cm Liver: No focal lesion identified. Nodular hepatic contour with mildly accentuated echogenicity. Portal vein is patent on color Doppler imaging with normal direction of blood flow towards the liver. ULTRASOUND HEPATIC ELASTOGRAPHY Device: Siemens Helix VTQ Patient position: Supine Transducer 5C1 Number of measurements: 10 Hepatic segment:  8 Median velocity:   1.73 m/sec IQR: 0.24 IQR/Median velocity ratio: 0.14 Corresponding Metavir fibrosis score:  F2 + some F3 Risk of fibrosis: Moderate Limitations of exam: None Please note that abnormal shear wave velocities may also  be identified in clinical settings other than with hepatic fibrosis, such as: acute hepatitis, elevated right heart and central venous pressures including use of beta blockers, veno-occlusive disease (Budd-Chiari), infiltrative processes such as mastocytosis/amyloidosis/infiltrative tumor, extrahepatic cholestasis, in the post-prandial state, and liver transplantation. Correlation with patient history, laboratory data, and clinical condition recommended. IMPRESSION: ULTRASOUND ABDOMEN: 1. Nodular hepatic contour with mildly accentuated echogenicity compatible with cirrhosis. No focal hepatic mass identified. ULTRASOUND HEPATIC ELASTOGRAHY: Median hepatic shear wave velocity is calculated at 1.73 m/sec. Corresponding Metavir fibrosis score is F2 + some F3. Risk of fibrosis is moderate. Follow-up: Additional testing appropriate Electronically Signed   By: Van Clines M.D.   On: 10/09/2018 09:00    Assessment and Plan:   TYRA GAITHER is a 63  y.o. y/o male with history of hepatitis C and alcohol use, and cirrhosis here for follow-up  Abdominal pain may be related to his bowel movements and he is reporting inability to go when he tries to evacuate 50% of the time when he attempts  Start Metamucil daily  However, given that he almost went to the ER with abdominal pain last week, will order CT abdomen pelvis to rule out any other underlying etiology.  Colonoscopy and EGD does not reveal any etiology of his pain  I have again discussed the importance of abstaining from any alcohol use due to his cirrhosis.  As per PCP notes, hep C was diagnosed in 2017 and since it has been years since his first diagnosis, he now has chronic hep C  Need for repeat work-up that was previously ordered and patient did not get done, was discussed patient want to get this done today.  This will check for hep C antibody, hepatitis B and a work-up as well.  Avoid hepatotoxic drugs  We will start him on carvedilol 6.25 once daily for primary prophylaxis due to his varices.  His heart rate and blood pressure should be able to tolerate this.  We will recheck on next visit in 2 to 4 weeks and increase dose to twice daily if possible.  Follow-up with PCP closely adjust his other blood pressure medications if needed depending on how his blood pressure is doing.   Dr [name]

## 2018-10-16 LAB — COMPREHENSIVE METABOLIC PANEL
ALT: 127 IU/L — ABNORMAL HIGH (ref 0–44)
AST: 166 IU/L — ABNORMAL HIGH (ref 0–40)
Albumin/Globulin Ratio: 0.9 — ABNORMAL LOW (ref 1.2–2.2)
Albumin: 3.9 g/dL (ref 3.8–4.8)
Alkaline Phosphatase: 60 IU/L (ref 39–117)
BUN/Creatinine Ratio: 18 (ref 10–24)
BUN: 17 mg/dL (ref 8–27)
Bilirubin Total: 0.8 mg/dL (ref 0.0–1.2)
CO2: 18 mmol/L — ABNORMAL LOW (ref 20–29)
Calcium: 9.1 mg/dL (ref 8.6–10.2)
Chloride: 97 mmol/L (ref 96–106)
Creatinine, Ser: 0.94 mg/dL (ref 0.76–1.27)
GFR calc Af Amer: 99 mL/min/{1.73_m2} (ref >59)
GFR calc non Af Amer: 86 mL/min/{1.73_m2} (ref >59)
Globulin, Total: 4.3 g/dL (ref 1.5–4.5)
Glucose: 153 mg/dL — ABNORMAL HIGH (ref 65–99)
Potassium: 5.7 mmol/L — ABNORMAL HIGH (ref 3.5–5.2)
Sodium: 131 mmol/L — ABNORMAL LOW (ref 134–144)
Total Protein: 8.2 g/dL (ref 6.0–8.5)

## 2018-10-16 LAB — HEPATITIS C ANTIBODY (REFLEX): HCV Ab: >11 s/co ratio — ABNORMAL HIGH (ref 0.0–0.9)

## 2018-10-16 LAB — HEPATITIS B SURFACE ANTIGEN: Hepatitis B Surface Ag: NEGATIVE

## 2018-10-16 LAB — HEPATITIS B CORE ANTIBODY, TOTAL: Hep B Core Total Ab: POSITIVE — AB

## 2018-10-16 LAB — HEPATITIS B SURFACE ANTIBODY,QUALITATIVE: Hep B Surface Ab, Qual: REACTIVE

## 2018-10-16 LAB — HCV RNA QUANT
HCV log10: 6.976 log10 IU/mL
Hepatitis C Quantitation: 9470000 IU/mL

## 2018-10-16 LAB — COMMENT2 - HEP PANEL

## 2018-10-16 LAB — HEPATITIS A ANTIBODY, TOTAL: hep A Total Ab: POSITIVE — AB

## 2018-10-17 ENCOUNTER — Ambulatory Visit: Payer: Self-pay | Admitting: Gastroenterology

## 2018-10-17 ENCOUNTER — Telehealth: Payer: Self-pay

## 2018-10-17 NOTE — Telephone Encounter (Signed)
-----   Message from Virgel Manifold, MD sent at 10/17/2018 11:53 AM EDT ----- Caryl Pina please let the patient know, his blood work shows that he has had previous exposure to hepatitis B and is now immune to it from natural infection/immunity.  He is also immune to hepatitis A.  He has hepatitis C which needs to be treated.  Please talk to Ginger and forward the results to her to start treatment approval process

## 2018-10-17 NOTE — Telephone Encounter (Signed)
Paperwork will be completed and faxed to [facility].

## 2018-10-17 NOTE — Telephone Encounter (Signed)
Called and patient verbalized understanding of lab results. He would like to start medication for Hep C can we do the approval for this

## 2018-10-19 ENCOUNTER — Ambulatory Visit: Admission: RE | Admit: 2018-10-19 | Payer: Medicaid Other | Source: Ambulatory Visit

## 2018-10-24 ENCOUNTER — Other Ambulatory Visit: Payer: Self-pay

## 2018-10-24 ENCOUNTER — Telehealth: Payer: Self-pay

## 2018-10-24 ENCOUNTER — Ambulatory Visit
Admission: RE | Admit: 2018-10-24 | Discharge: 2018-10-24 | Disposition: A | Discharge status: Home / Self Care | Payer: Medicaid Other | Source: Ambulatory Visit | Attending: Gastroenterology | Admitting: Gastroenterology

## 2018-10-24 DIAGNOSIS — R1084 Generalized abdominal pain: Secondary | ICD-10-CM | POA: Diagnosis not present

## 2018-10-24 DIAGNOSIS — K7030 Alcoholic cirrhosis of liver without ascites: Secondary | ICD-10-CM

## 2018-10-24 MED ORDER — IOHEXOL 300 MG/ML  SOLN
100.0000 mL | Freq: Once | INTRAMUSCULAR | Status: AC | PRN
  Administered 2018-10-24: 100 mL via INTRAVENOUS

## 2018-10-24 NOTE — Telephone Encounter (Signed)
Pharmacy number is [phone]. Called and left a message for them

## 2018-10-24 NOTE — Telephone Encounter (Signed)
Pharmacy is calling because there is a interaction with patient hep C medication which is called [place] with Lipitor. Pharmacist stated that this increased the Lipitor and he needed to be off this medication while he on the [place]. Talked to Dr. Bonna Gains and she states that is a long time with out the Lipitor. She wants to see if another  Hep c medication can be approved

## 2018-10-24 NOTE — Telephone Encounter (Signed)
EXT [id number]. Called and left a message for call back

## 2018-10-24 NOTE — Telephone Encounter (Signed)
PCP gave the okay for patient to be off the Lipitor for 8 weeks for the Hep C treatment.

## 2018-10-24 NOTE — Telephone Encounter (Addendum)
Called PCP office Gennette Pac to find out if she was okay with patient being off the Lipitor for 8 weeks during the Hep C treatment. Ginger stated with his insurance this medication is the only medication that will be approved. They sent a message to the PCP.

## 2018-10-25 NOTE — Telephone Encounter (Signed)
Called patient and informed patient that his hep C medication was approved but there was a interaction between it and the Lipitor Informed him I contacted his PCP office and they are okay with him stopping the Lipitor for 8 weeks while he on the hep c medication. Patient verablized understanding that when he starts the [place] he will stop the Lipitor.

## 2018-10-25 NOTE — Telephone Encounter (Signed)
Called and left a message for call back  

## 2018-10-25 NOTE — Telephone Encounter (Signed)
Sent our rep a email

## 2018-10-25 NOTE — Telephone Encounter (Signed)
Talk to [name] and gave her the information about the PCP being okay with him being off the Lipitor for 8 weeks. She states she will get medication to him

## 2018-11-01 NOTE — Telephone Encounter (Signed)
Please advised about patient Hep C medication.

## 2018-11-14 ENCOUNTER — Ambulatory Visit: Payer: Medicaid Other | Admitting: Gastroenterology

## 2018-11-19 ENCOUNTER — Ambulatory Visit: Payer: Medicaid Other | Admitting: Gastroenterology

## 2018-11-20 ENCOUNTER — Ambulatory Visit: Payer: Medicaid Other | Admitting: Gastroenterology

## 2018-11-26 ENCOUNTER — Other Ambulatory Visit: Payer: Self-pay

## 2018-11-26 ENCOUNTER — Ambulatory Visit: Payer: Medicaid Other | Admitting: Gastroenterology

## 2018-11-26 ENCOUNTER — Encounter: Payer: Self-pay | Admitting: Gastroenterology

## 2018-11-26 VITALS — BP 133/81 | HR 67 | Temp 98.0°F | Wt 184.5 lb

## 2018-11-26 DIAGNOSIS — Z8619 Personal history of other infectious and parasitic diseases: Secondary | ICD-10-CM | POA: Diagnosis not present

## 2018-11-26 DIAGNOSIS — R197 Diarrhea, unspecified: Secondary | ICD-10-CM

## 2018-11-26 DIAGNOSIS — R748 Abnormal levels of other serum enzymes: Secondary | ICD-10-CM

## 2018-11-26 NOTE — Progress Notes (Signed)
Kirk Antigua, MD [address]  [place]  [place], [place] [address]  Main: [phone]  Fax: [phone]   Primary Care Physician: Kirk Pac, FNP   Chief Complaint  Patient presents with  . Abdominal Pain    Patient is having lower abdominal pain and diarrhea 10 times a day. He is tolerating the [place]    HPI: [name] is a 63 y.o. 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  Patient continues to report multiple bowel movements a day, 10 times a day.  Reports his stools to be 5-6 and consistently a.  Not 7.  No blood in stool.  No weight loss.  No nausea or vomiting.  Patient quit drinking 3 weeks ago.  Before that used to drink 12 pack of beer every weekend.  Hep C was previously diagnosed in 2017 was lost to follow-up  labs also show previous exposure to hepatitis B and natural immunity with positive hep B surface antibody negative surface antigen.  Previous history: Labs from PCP office obtained and show her anemia approximately 800,000, genotype 2B, low platelets around 88, INR 1.1, elevated transaminases in the 100s.  EGD September 2020 showed grade 1 esophageal varices, nonbleeding, 3 columns KOH prep showed yeast and fluconazole as prescribed Portal hypertensive gastropathy noted.  Colonoscopy showed fair prep.  Nonbleeding AVMs.  Diverticulosis.  DIAGNOSIS:  A. 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  - SUPERFICIAL VASCULAR CONGESTION WITH ENDOSCOPIC IMPRESSION OF PORTAL  HYPERTENSIVE GASTROPATHY.  - NEGATIVE FOR H. PYLORI, DYSPLASIA, AND MALIGNANCY.   Right upper quadrant ultrasound, September 2020, with nodular hepatic contour of the liver.  No focal mass.   Elastography with fibrosis score F2 + F3.  Current Outpatient Medications  Medication Sig Dispense Refill  . canagliflozin (INVOKANA) 100 MG TABS tablet Take 100 mg by mouth daily before breakfast.    . carvedilol (COREG) 6.25 MG tablet Take 1 tablet (6.25 mg total) by mouth daily. 30 tablet 1  . Glecaprevir-Pibrentasvir (MAVYRET PO) Take by mouth.    Marland Kitchen lisinopril (ZESTRIL) 20 MG tablet Take 20 mg by mouth daily.    . metFORMIN (GLUCOPHAGE) 1000 MG tablet Take 1,000 mg by mouth 2 (two) times daily with a meal.    . sildenafil (VIAGRA) 100 MG tablet Take by mouth.    Marland Kitchen atorvastatin (LIPITOR) 40 MG tablet Take 40 mg by mouth daily.     No current facility-administered medications for this visit.     Allergies as of 11/26/2018  . (No Known Allergies)    ROS:  General: Negative for anorexia, weight loss, fever, chills, fatigue, weakness. ENT: Negative for hoarseness, difficulty swallowing , nasal congestion. CV: Negative for chest pain, angina, palpitations, dyspnea on exertion, peripheral edema.  Respiratory: Negative for dyspnea at rest, dyspnea on exertion, cough, sputum, wheezing.  GI: See history of present illness. GU:  Negative for dysuria, hematuria, urinary incontinence, urinary frequency, nocturnal urination.  Endo: Negative for unusual weight change.    Physical Examination:   BP 133/81 (BP Location: Left Arm, Patient Position: Sitting, Cuff Size: Normal)   Pulse 67   Temp 98 F (36.7 C) (Oral)   Wt 184 lb 8 oz (83.7 kg)   BMI 26.47  kg/m   General: Well-nourished, well-developed in no acute distress.  Eyes: No icterus. Conjunctivae pink. Mouth: Oropharyngeal mucosa moist and pink , no lesions erythema or exudate. Neck: Supple, Trachea midline Abdomen: Bowel sounds are normal, nontender, nondistended, no hepatosplenomegaly or masses, no abdominal bruits or hernia , no rebound or guarding.   Extremities: No lower extremity edema. No clubbing or deformities. Neuro:  Alert and oriented x 3.  Grossly intact. Skin: Warm and dry, no jaundice.   Psych: Alert and cooperative, normal mood and affect.   Labs: CMP     Component Value Date/Time   NA 131 (L) 10/15/2018 0959   NA 130 (L) 03/26/2013 1203   K 5.7 (H) 10/15/2018 0959   K 4.6 03/26/2013 1203   CL 97 10/15/2018 0959   CL 94 (L) 03/26/2013 1203   CO2 18 (L) 10/15/2018 0959   CO2 23 03/26/2013 1203   GLUCOSE 153 (H) 10/15/2018 0959   GLUCOSE 418 (H) 08/12/2017 1650   GLUCOSE 507 (HH) 03/26/2013 1203   BUN 17 10/15/2018 0959   BUN 18 03/26/2013 1203   CREATININE 0.94 10/15/2018 0959   CREATININE 0.93 03/26/2013 1203   CALCIUM 9.1 10/15/2018 0959   CALCIUM 9.5 03/26/2013 1203   PROT 8.2 10/15/2018 0959   PROT 8.6 (H) 03/26/2013 1203   ALBUMIN 3.9 10/15/2018 0959   ALBUMIN 3.5 03/26/2013 1203   AST 166 (H) 10/15/2018 0959   AST 179 (H) 03/26/2013 1203   ALT 127 (H) 10/15/2018 0959   ALT 245 (H) 03/26/2013 1203   ALKPHOS 60 10/15/2018 0959   ALKPHOS 190 (H) 03/26/2013 1203   BILITOT 0.8 10/15/2018 0959   BILITOT 0.6 03/26/2013 1203   GFRNONAA 86 10/15/2018 0959   GFRNONAA >60 03/26/2013 1203   GFRAA 99 10/15/2018 0959   GFRAA >60 03/26/2013 1203   Lab Results  Component Value Date   WBC 7.0 08/12/2017   HGB 14.5 08/12/2017   HCT 40.8 08/12/2017   MCV 94.3 08/12/2017   PLT 112 (L) 08/12/2017    Imaging Studies: No results found.  Assessment and Plan:   [name] is a 63 y.o. y/o male with history of stenosis, due to previous alcohol abuse, hepatitis C, hepatitis B, esophageal varices, here for follow-up  Hepatitis C Mavyret treatment started on October 5 Continue as prescribed  Hepatitis B Natural immunity noted We will check DNA level to ensure no reactivation while hep C treatment is underway  Abdominal pain and diarrhea CT was negative for any acute inflammation Will obtain inflammatory markers and other stool testing Start Metamucil daily to help bulk stool   Elevated liver enzymes We will repeat and check if they are improving Continue to avoid hepatotoxic drugs Encouraged to continue abstinence  Cirrhosis and esophageal varices Tolerating carvedilol 6.25 mg once daily.  Heart rate 67 today.  Will not increase dosage at this time No signs of decompensation We will obtain labs and calculate meld score HCC screening with imaging up-to-date    Dr [name]

## 2018-11-27 LAB — COMPREHENSIVE METABOLIC PANEL
ALT: 48 IU/L — ABNORMAL HIGH (ref 0–44)
AST: 75 IU/L — ABNORMAL HIGH (ref 0–40)
Albumin/Globulin Ratio: 1 — ABNORMAL LOW (ref 1.2–2.2)
Albumin: 3.9 g/dL (ref 3.8–4.8)
Alkaline Phosphatase: 73 IU/L (ref 39–117)
BUN/Creatinine Ratio: 13 (ref 10–24)
BUN: 13 mg/dL (ref 8–27)
Bilirubin Total: 0.9 mg/dL (ref 0.0–1.2)
CO2: 23 mmol/L (ref 20–29)
Calcium: 9.5 mg/dL (ref 8.6–10.2)
Chloride: 103 mmol/L (ref 96–106)
Creatinine, Ser: 0.97 mg/dL (ref 0.76–1.27)
GFR calc Af Amer: 96 mL/min/{1.73_m2} (ref >59)
GFR calc non Af Amer: 83 mL/min/{1.73_m2} (ref >59)
Globulin, Total: 4.1 g/dL (ref 1.5–4.5)
Glucose: 114 mg/dL — ABNORMAL HIGH (ref 65–99)
Potassium: 4.6 mmol/L (ref 3.5–5.2)
Sodium: 140 mmol/L (ref 134–144)
Total Protein: 8 g/dL (ref 6.0–8.5)

## 2018-11-27 LAB — HEPATITIS B DNA, ULTRAQUANTITATIVE, PCR: HBV DNA SERPL PCR-ACNC: NOT DETECTED IU/mL

## 2018-11-27 LAB — PROTIME-INR
INR: 1.1 (ref 0.9–1.2)
Prothrombin Time: 11.4 s (ref 9.1–12.0)

## 2018-11-28 ENCOUNTER — Other Ambulatory Visit: Payer: Self-pay | Admitting: Gastroenterology

## 2018-12-04 ENCOUNTER — Telehealth: Payer: Self-pay

## 2018-12-04 MED ORDER — LOPERAMIDE HCL 2 MG PO TABS: 2.0000 mg | ORAL_TABLET | Freq: Four times a day (QID) | ORAL | 0 refills | Status: AC | PRN

## 2018-12-04 NOTE — Telephone Encounter (Signed)
-----   Message from Virgel Manifold, MD sent at 12/04/2018 11:56 AM EST ----- Caryl Pina please let the patient know, his stool tests did not show an infection. Lets start imodium every 4 hrs as needed for diarrhea, 30 pills only

## 2018-12-04 NOTE — Telephone Encounter (Signed)
Patient verbalized understanding of lab results. Sent Imodium to the pharmacy

## 2018-12-05 LAB — GI PROFILE, STOOL, PCR

## 2018-12-05 LAB — PANCREATIC ELASTASE, FECAL: Pancreatic Elastase, Fecal: >500 ug Elast./g (ref >200)

## 2018-12-05 LAB — CALPROTECTIN, FECAL: Calprotectin, Fecal: 253 ug/g — ABNORMAL HIGH (ref 0–120)

## 2019-01-07 ENCOUNTER — Other Ambulatory Visit: Payer: Self-pay

## 2019-01-07 ENCOUNTER — Ambulatory Visit (INDEPENDENT_AMBULATORY_CARE_PROVIDER_SITE_OTHER): Payer: Medicaid Other | Admitting: Gastroenterology

## 2019-01-07 ENCOUNTER — Ambulatory Visit: Payer: Medicaid Other | Admitting: Gastroenterology

## 2019-01-07 ENCOUNTER — Encounter: Payer: Self-pay | Admitting: Gastroenterology

## 2019-01-07 DIAGNOSIS — R197 Diarrhea, unspecified: Secondary | ICD-10-CM | POA: Diagnosis not present

## 2019-01-07 DIAGNOSIS — R748 Abnormal levels of other serum enzymes: Secondary | ICD-10-CM

## 2019-01-09 ENCOUNTER — Telehealth: Payer: Self-pay

## 2019-01-09 NOTE — Telephone Encounter (Signed)
-----   Message from Virgel Manifold, MD sent at 01/09/2019 10:57 AM EST ----- I have ordered labs for this patient that are supposed to be done 4 weeks from now to make sure he has cleared the hepatitis C virus after his treatment.  Please ask him to get this done in 4 weeks.

## 2019-01-09 NOTE — Telephone Encounter (Signed)
Called and patient verbalized understanding  

## 2019-01-09 NOTE — Progress Notes (Signed)
[name], MD [address]  [place]  [place], [place] [address]  Main: [phone]  Fax: [phone]   Primary Care Physician: Gennette Pac, [place]  Virtual Visit via Telephone Note  I connected with patient on 01/09/19 at  2:30 PM EST by telephone and verified that I am speaking with the correct person using two identifiers.   I discussed the limitations, risks, security and privacy concerns of performing an evaluation and management service by telephone and the availability of in person appointments. I also discussed with the patient that there may be a patient responsible charge related to this service. The patient expressed understanding and agreed to proceed.  Location of Patient: Home Location of Provider: Home Persons involved: Patient and provider only during the visit (nursing staff and front desk staff was involved in communicating with the patient prior to the appointment, reviewing medications and checking them in)   History of Present Illness: Chief Complaint  Patient presents with  . Follow-up    Patient is here to discuss the colonoscopy and Endoscopy      HPI: [name] is a 63 y.o. male history of hep C, completed Mavyret therapy, cirrhosis due to alcohol use and hepatitis C, here for follow-up of abdominal pain and diarrhea.  States over the last 2 days he has only had 1 bowel movement today compared to the multiple bowel movements he was having every day before then.  No blood in stool.  No weight loss.  No nausea or vomiting.  Abdominal pain is more like a cramping sensation that occurs when he has a bowel movement, good appetite otherwise.  Continues to remain abstinent.  labs also show previous exposure to hepatitis B and natural immunity with positive hep B surface antibody negative surface antigen.  Previous history:  EGD September 2020 showed grade 1 esophageal varices, nonbleeding, 3 columns KOH prep showed yeast and  fluconazole as prescribed Portal hypertensive gastropathy noted.  Colonoscopy showed fair prep. Nonbleeding AVMs. Diverticulosis.  DIAGNOSIS:  A. 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  - SUPERFICIAL VASCULAR CONGESTION WITH ENDOSCOPIC IMPRESSION OF PORTAL  HYPERTENSIVE GASTROPATHY.  - NEGATIVE FOR H. PYLORI, DYSPLASIA, AND MALIGNANCY.   Right upper quadrant ultrasound, September 2020, with nodular hepatic contour of the liver. No focal mass. Elastography with fibrosis score F2 + F3.  Current Outpatient Medications  Medication Sig Dispense Refill  . atorvastatin (LIPITOR) 40 MG tablet Take 40 mg by mouth daily.    . canagliflozin (INVOKANA) 100 MG TABS tablet Take 100 mg by mouth daily before breakfast.    . carvedilol (COREG) 6.25 MG tablet Take 1 tablet (6.25 mg total) by mouth daily. 30 tablet 1  . Glecaprevir-Pibrentasvir (MAVYRET PO) Take by mouth.    Marland Kitchen lisinopril (ZESTRIL) 20 MG tablet Take 20 mg by mouth daily.    Marland Kitchen loperamide (IMODIUM A-D) 2 MG tablet Take 1 tablet (2 mg total) by mouth 4 (four) times daily as needed for diarrhea or loose stools. 30 tablet 0  . metFORMIN (GLUCOPHAGE) 1000 MG tablet Take 1,000 mg by mouth 2 (two) times daily with a meal.     No current facility-administered medications for this visit.     Allergies as of 01/07/2019  . (No Known Allergies)    Review of Systems:    All systems reviewed and negative except where noted in HPI.   Observations/Objective:  Labs: CMP     Component Value Date/Time   NA 140 11/26/2018 1450   NA 130 (L) 03/26/2013 1203   K 4.6 11/26/2018 1450   K 4.6 03/26/2013 1203   CL 103 11/26/2018 1450   CL 94 (L) 03/26/2013 1203   CO2 23 11/26/2018 1450   CO2 23 03/26/2013 1203   GLUCOSE 114 (H) 11/26/2018 1450   GLUCOSE 418 (H) 08/12/2017 1650   GLUCOSE 507 (HH) 03/26/2013 1203   BUN 13 11/26/2018 1450    BUN 18 03/26/2013 1203   CREATININE 0.97 11/26/2018 1450   CREATININE 0.93 03/26/2013 1203   CALCIUM 9.5 11/26/2018 1450   CALCIUM 9.5 03/26/2013 1203   PROT 8.0 11/26/2018 1450   PROT 8.6 (H) 03/26/2013 1203   ALBUMIN 3.9 11/26/2018 1450   ALBUMIN 3.5 03/26/2013 1203   AST 75 (H) 11/26/2018 1450   AST 179 (H) 03/26/2013 1203   ALT 48 (H) 11/26/2018 1450   ALT 245 (H) 03/26/2013 1203   ALKPHOS 73 11/26/2018 1450   ALKPHOS 190 (H) 03/26/2013 1203   BILITOT 0.9 11/26/2018 1450   BILITOT 0.6 03/26/2013 1203   GFRNONAA 83 11/26/2018 1450   GFRNONAA >60 03/26/2013 1203   GFRAA 96 11/26/2018 1450   GFRAA >60 03/26/2013 1203   Lab Results  Component Value Date   WBC 7.0 08/12/2017   HGB 14.5 08/12/2017   HCT 40.8 08/12/2017   MCV 94.3 08/12/2017   PLT 112 (L) 08/12/2017    Imaging Studies: No results found.  Assessment and Plan:   [name] is a 63 y.o. y/o male here for follow-up of cirrhosis due to alcohol abuse and hep C, status post Mavyret therapy, and follow-up of abdominal pain and diarrhea  Assessment and Plan: Hep C Completed Mavyret therapy for 8 weeks, started on October 5 Repeat hep C RNA  Abdominal pain and diarrhea CT negative for any acute inflammation Fecal calprotectin was elevated However, colposcopy this year did not show any evidence of IBD Over the last 2 days he has only had 1 bowel movement a day.  As long as it continues to improve, continue conservative management.  We will  recheck fecal calprotectin and if elevated or if symptoms return, can consider repeat colonoscopy.  Cirrhosis and esophageal varices Continue abstinence Tolerating carvedilol 6.25 once daily No signs of decompensation Ultrasound imaging up-to-date Repeat liver enzymes  Follow Up Instructions: 3 months   I discussed the assessment and treatment plan with the patient. The patient was provided an opportunity to ask questions and all were answered. The patient agreed  with the plan and demonstrated an understanding of the instructions.   The patient was advised to call back or seek an in-person evaluation if the symptoms worsen or if the condition fails to improve as anticipated.  I provided 13 minutes of non-face-to-face time during this encounter. Additional time was spent in reviewing patient's chart, placing orders etc.   Virgel Manifold, MD  Speech recognition software was used to dictate this note.

## 2019-01-14 ENCOUNTER — Ambulatory Visit: Payer: Medicaid Other | Admitting: Gastroenterology

## 2019-01-15 NOTE — Telephone Encounter (Signed)
Made appointment for Thursday at 11:00

## 2019-01-17 ENCOUNTER — Ambulatory Visit: Payer: Medicaid Other | Admitting: Gastroenterology

## 2019-01-17 ENCOUNTER — Other Ambulatory Visit: Payer: Self-pay

## 2019-01-17 ENCOUNTER — Encounter: Payer: Self-pay | Admitting: Gastroenterology

## 2019-01-17 VITALS — BP 169/96 | HR 68 | Temp 97.6°F | Wt 186.0 lb

## 2019-01-17 DIAGNOSIS — R1030 Lower abdominal pain, unspecified: Secondary | ICD-10-CM | POA: Diagnosis not present

## 2019-01-17 DIAGNOSIS — R197 Diarrhea, unspecified: Secondary | ICD-10-CM

## 2019-01-17 DIAGNOSIS — R1084 Generalized abdominal pain: Secondary | ICD-10-CM | POA: Diagnosis not present

## 2019-01-17 NOTE — Progress Notes (Signed)
[name], MD [address]  [place]  [place], [place] [address]  Main: [phone]  Fax: [phone]   Primary Care Physician: Gennette Pac, FNP   Chief Complaint  Patient presents with  . Abdominal Pain    Patient states he has a sharp constant pain in his lower abdominal. Has some nausea in the morning     HPI: [name] is a 63 y.o. male here to discuss abdominal pain.  Reports bilateral lower quadrant abdominal pain, ongoing for 4 to 5 months.  States this has not improved.  Still reports 4-10 loose bowel movements a day, although states these are getting.  No blood in stool.  No nausea or vomiting.  States it may have some correlation to eating and it may get worse after eating but is not completely sure about that.  He had a CT abdomen pelvis in March 2020 for this with no evidence of enteritis or colitis.  Stigmata of cirrhosis and splenomegaly was noted.  He continues to remain abstinent  Previous history: History of hepatitis C and alcohol cirrhosis, completed Mavyret therapy for 8 weeks.  Started on November 05, 2018  labs also show previous exposure to hepatitis B and natural immunity with positive hep B surface antibody negative surface antigen.  EGD September 2020 showed grade 1 esophageal varices, nonbleeding, 3 columns KOH prep showed yeast and fluconazole as prescribed Portal hypertensive gastropathy noted.  Colonoscopy showed fair prep. Nonbleeding AVMs. Diverticulosis.  DIAGNOSIS:  A. 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  - SUPERFICIAL VASCULAR CONGESTION WITH ENDOSCOPIC IMPRESSION OF PORTAL  HYPERTENSIVE GASTROPATHY.  - NEGATIVE FOR H. PYLORI, DYSPLASIA, AND MALIGNANCY.   Right upper quadrant ultrasound, September 2020, with nodular hepatic contour of the liver. No focal mass. Elastography with fibrosis score F2 +  F3.  Past Medical History:  Diagnosis Date  . Anxiety   . Diabetes mellitus without complication ([place])   . GERD (gastroesophageal reflux disease)   . High cholesterol   . Hypertension      Current Outpatient Medications  Medication Sig Dispense Refill  . atorvastatin (LIPITOR) 40 MG tablet Take 40 mg by mouth daily.    . canagliflozin (INVOKANA) 100 MG TABS tablet Take 100 mg by mouth daily before breakfast.    . carvedilol (COREG) 6.25 MG tablet Take 1 tablet (6.25 mg total) by mouth daily. 30 tablet 1  . lisinopril (ZESTRIL) 20 MG tablet Take 20 mg by mouth daily.    Marland Kitchen loperamide (IMODIUM A-D) 2 MG tablet Take 1 tablet (2 mg total) by mouth 4 (four) times daily as needed for diarrhea or loose stools. 30 tablet 0  . metFORMIN (GLUCOPHAGE) 1000 MG tablet Take 1,000 mg by mouth 2 (two) times daily with a meal.     No current facility-administered medications for this visit.    Allergies as of 01/17/2019  . (No Known Allergies)    ROS:  General: Negative for anorexia, weight loss, fever, chills, fatigue, weakness. ENT: Negative for hoarseness, difficulty swallowing , nasal congestion. CV: Negative for chest pain, angina, palpitations, dyspnea on exertion, peripheral edema.  Respiratory: Negative for dyspnea at rest, dyspnea on exertion, cough, sputum, wheezing.  GI: See history of present illness. GU:  Negative for dysuria, hematuria, urinary incontinence, urinary frequency, nocturnal urination.  Endo: Negative for unusual weight change.    Physical Examination:   BP (!) 169/96 (  BP Location: Left Arm, Patient Position: Sitting, Cuff Size: Normal)   Pulse 68   Temp 97.6 F (36.4 C) (Oral)   Wt 186 lb (84.4 kg)   BMI 26.69 kg/m   General: Well-nourished, well-developed in no acute distress.  Eyes: No icterus. Conjunctivae pink. Mouth: Oropharyngeal mucosa moist and pink , no lesions erythema or exudate. Neck: Supple, Trachea midline Abdomen: Bowel sounds are normal,  nontender, nondistended, no hepatosplenomegaly or masses, no abdominal bruits or hernia , no rebound or guarding.   Extremities: No lower extremity edema. No clubbing or deformities. Neuro: Alert and oriented x 3.  Grossly intact. Skin: Warm and dry, no jaundice.   Psych: Alert and cooperative, normal mood and affect.   Labs: CMP     Component Value Date/Time   NA 140 11/26/2018 1450   NA 130 (L) 03/26/2013 1203   K 4.6 11/26/2018 1450   K 4.6 03/26/2013 1203   CL 103 11/26/2018 1450   CL 94 (L) 03/26/2013 1203   CO2 23 11/26/2018 1450   CO2 23 03/26/2013 1203   GLUCOSE 114 (H) 11/26/2018 1450   GLUCOSE 418 (H) 08/12/2017 1650   GLUCOSE 507 (HH) 03/26/2013 1203   BUN 13 11/26/2018 1450   BUN 18 03/26/2013 1203   CREATININE 0.97 11/26/2018 1450   CREATININE 0.93 03/26/2013 1203   CALCIUM 9.5 11/26/2018 1450   CALCIUM 9.5 03/26/2013 1203   PROT 8.0 11/26/2018 1450   PROT 8.6 (H) 03/26/2013 1203   ALBUMIN 3.9 11/26/2018 1450   ALBUMIN 3.5 03/26/2013 1203   AST 75 (H) 11/26/2018 1450   AST 179 (H) 03/26/2013 1203   ALT 48 (H) 11/26/2018 1450   ALT 245 (H) 03/26/2013 1203   ALKPHOS 73 11/26/2018 1450   ALKPHOS 190 (H) 03/26/2013 1203   BILITOT 0.9 11/26/2018 1450   BILITOT 0.6 03/26/2013 1203   GFRNONAA 83 11/26/2018 1450   GFRNONAA >60 03/26/2013 1203   GFRAA 96 11/26/2018 1450   GFRAA >60 03/26/2013 1203   Lab Results  Component Value Date   WBC 7.0 08/12/2017   HGB 14.5 08/12/2017   HCT 40.8 08/12/2017   MCV 94.3 08/12/2017   PLT 112 (L) 08/12/2017    Imaging Studies: No results found.  Assessment and Plan:   [name] is a 63 y.o. y/o male with bilateral lower quadrant abdominal pain ongoing for 4 to 5 months, described as severe  Due to ongoing abdominal pain, diarrhea, and some correlation with eating, would like to rule out ischemia  Obtain CTA abdomen pelvis to rule out ischemia  If this is negative, we may need to consider colonoscopy for  further evaluation  Repeat fecal calprotectin ordered and patient was advised to get this done.  We will also obtain repeat stool infectious panel  Repeat HCVRNA quant and liver enzymes in 3 weeks  Dr [name]

## 2019-01-17 NOTE — Patient Instructions (Addendum)
Your CT scan is scheduled for 01/18/2019 at out patient imaging arrive at 9:15am for 9:30am scan. Nothing to eat 4 hours before scan.

## 2019-01-18 ENCOUNTER — Other Ambulatory Visit: Payer: Self-pay

## 2019-01-18 ENCOUNTER — Ambulatory Visit
Admission: RE | Admit: 2019-01-18 | Discharge: 2019-01-18 | Disposition: A | Discharge status: Home / Self Care | Payer: Medicaid Other | Source: Ambulatory Visit | Attending: Gastroenterology | Admitting: Gastroenterology

## 2019-01-18 DIAGNOSIS — R197 Diarrhea, unspecified: Secondary | ICD-10-CM | POA: Diagnosis present

## 2019-01-18 DIAGNOSIS — R1030 Lower abdominal pain, unspecified: Secondary | ICD-10-CM | POA: Insufficient documentation

## 2019-01-18 LAB — POCT I-STAT CREATININE: Creatinine, Ser: 0.8 mg/dL (ref 0.61–1.24)

## 2019-01-18 MED ORDER — IOHEXOL 350 MG/ML SOLN
100.0000 mL | Freq: Once | INTRAVENOUS | Status: AC | PRN
  Administered 2019-01-18: 100 mL via INTRAVENOUS

## 2019-01-21 ENCOUNTER — Telehealth: Payer: Self-pay

## 2019-01-21 DIAGNOSIS — R9389 Abnormal findings on diagnostic imaging of other specified body structures: Secondary | ICD-10-CM

## 2019-01-21 NOTE — Telephone Encounter (Signed)
-----   Message from Virgel Manifold, MD sent at 01/21/2019  2:21 PM EST ----- [name] please let the patient know, his CT scan shows that his bowels are normal.  However, there is an area of fat or fluid collection that may have had some inflammation around it and can explain his pain.  Usually this does not need any interventions and resolved on its own.  However, given that he has had ongoing pain, I would recommend surgical referral.  Please refer to Dr. Hampton Abbot for further evaluation.

## 2019-01-21 NOTE — Telephone Encounter (Signed)
Patient verbalized understanding. Put in referral

## 2019-01-22 ENCOUNTER — Other Ambulatory Visit: Payer: Self-pay

## 2019-01-22 ENCOUNTER — Ambulatory Visit (INDEPENDENT_AMBULATORY_CARE_PROVIDER_SITE_OTHER): Payer: Medicaid Other | Admitting: Gastroenterology

## 2019-01-22 ENCOUNTER — Encounter: Payer: Self-pay | Admitting: Gastroenterology

## 2019-01-22 DIAGNOSIS — R109 Unspecified abdominal pain: Secondary | ICD-10-CM

## 2019-01-23 LAB — CALPROTECTIN, FECAL: Calprotectin, Fecal: 152 ug/g — ABNORMAL HIGH (ref 0–120)

## 2019-01-23 NOTE — Progress Notes (Signed)
Kirk Antigua, MD [address]  [place]  [place], [place] [address]  Main: [phone]  Fax: [phone]   Primary Care Physician: Gennette Pac, [place]  Virtual Visit via Telephone Note  I connected with patient on 01/23/19 at  1:30 PM EST by telephone and verified that I am speaking with the correct person using two identifiers.   I discussed the limitations, risks, security and privacy concerns of performing an evaluation and management service by telephone and the availability of in person appointments. I also discussed with the patient that there may be a patient responsible charge related to this service. The patient expressed understanding and agreed to proceed.  Location of Patient: Home Location of Provider: Home Persons involved: Patient and provider only during the visit (nursing staff and front desk staff was involved in communicating with the patient prior to the appointment, reviewing medications and checking them in)   History of Present Illness: Chief Complaint  Patient presents with  . Abdominal Pain    Patient wants to discuss the CT scan      HPI: [name] is a 63 y.o. male With complaints of lower abdominal painWho underwent a CTA due to ongoing abdominal pain despite extensive work-up.  Patient here to discuss CT scan results.  Patient reports that yesterday his pain was better but it is still continuing on a daily basis.  Ongoing for 4 to 5 months, bilateral lower quadrant.  No blood in his stool.  See CTA results below.  Previous work-up: He had a CT abdomen pelvis in March 2020 for this with no evidence of enteritis or colitis.  Stigmata of cirrhosis and splenomegaly was noted.  He continues to remain abstinent  Previous history: History of hepatitis C and alcohol cirrhosis, completed Mavyret therapy for 8 weeks.  Started on November 05, 2018  labs also show previous exposure to hepatitis B and natural immunity with positive hep B  surface antibody negative surface antigen.  EGD September 2020 showed grade 1 esophageal varices, nonbleeding, 3 columns KOH prep showed yeast and fluconazole as prescribed Portal hypertensive gastropathy noted.  Colonoscopy showed fair prep. Nonbleeding AVMs. Diverticulosis.  DIAGNOSIS:  A. 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  - SUPERFICIAL VASCULAR CONGESTION WITH ENDOSCOPIC IMPRESSION OF PORTAL  HYPERTENSIVE GASTROPATHY.  - NEGATIVE FOR H. PYLORI, DYSPLASIA, AND MALIGNANCY.   Right upper quadrant ultrasound, September 2020, with nodular hepatic contour of the liver. No focal mass. Elastography with fibrosis score F2 + F3.  Current Outpatient Medications  Medication Sig Dispense Refill  . atorvastatin (LIPITOR) 40 MG tablet Take 40 mg by mouth daily.    . canagliflozin (INVOKANA) 100 MG TABS tablet Take 100 mg by mouth daily before breakfast.    . carvedilol (COREG) 6.25 MG tablet Take 1 tablet (6.25 mg total) by mouth daily. 30 tablet 1  . lisinopril (ZESTRIL) 20 MG tablet Take 20 mg by mouth daily.    Marland Kitchen loperamide (IMODIUM A-D) 2 MG tablet Take 1 tablet (2 mg total) by mouth 4 (four) times daily as needed for diarrhea or loose stools. 30 tablet 0  . metFORMIN (GLUCOPHAGE) 1000 MG tablet Take 1,000 mg by mouth 2 (two) times daily with a meal.     No current facility-administered medications for this visit.    Allergies as of 01/22/2019  . (No Known Allergies)    Review of Systems:  All systems reviewed and negative except where noted in HPI.   Observations/Objective:  Labs: CMP     Component Value Date/Time   NA 140 11/26/2018 1450   NA 130 (L) 03/26/2013 1203   K 4.6 11/26/2018 1450   K 4.6 03/26/2013 1203   CL 103 11/26/2018 1450   CL 94 (L) 03/26/2013 1203   CO2 23 11/26/2018 1450   CO2 23 03/26/2013 1203   GLUCOSE 114 (H) 11/26/2018 1450   GLUCOSE  418 (H) 08/12/2017 1650   GLUCOSE 507 (HH) 03/26/2013 1203   BUN 13 11/26/2018 1450   BUN 18 03/26/2013 1203   CREATININE 0.80 01/18/2019 0946   CREATININE 0.93 03/26/2013 1203   CALCIUM 9.5 11/26/2018 1450   CALCIUM 9.5 03/26/2013 1203   PROT 8.0 11/26/2018 1450   PROT 8.6 (H) 03/26/2013 1203   ALBUMIN 3.9 11/26/2018 1450   ALBUMIN 3.5 03/26/2013 1203   AST 75 (H) 11/26/2018 1450   AST 179 (H) 03/26/2013 1203   ALT 48 (H) 11/26/2018 1450   ALT 245 (H) 03/26/2013 1203   ALKPHOS 73 11/26/2018 1450   ALKPHOS 190 (H) 03/26/2013 1203   BILITOT 0.9 11/26/2018 1450   BILITOT 0.6 03/26/2013 1203   GFRNONAA 83 11/26/2018 1450   GFRNONAA >60 03/26/2013 1203   GFRAA 96 11/26/2018 1450   GFRAA >60 03/26/2013 1203   Lab Results  Component Value Date   WBC 7.0 08/12/2017   HGB 14.5 08/12/2017   HCT 40.8 08/12/2017   MCV 94.3 08/12/2017   PLT 112 (L) 08/12/2017    Imaging Studies: CT Angio Abd/Pel w/ and/or w/o  Result Date: 01/18/2019 CLINICAL DATA:  63 year old male with a history of abdominal pain, possible mesenteric ischemia EXAM: CTA ABDOMEN AND PELVIS WITHOUT AND WITH CONTRAST TECHNIQUE: Multidetector CT imaging of the abdomen and pelvis was performed using the standard protocol during bolus administration of intravenous contrast. Multiplanar reconstructed images and MIPs were obtained and reviewed to evaluate the vascular anatomy. CONTRAST:  17mL OMNIPAQUE IOHEXOL 350 MG/ML SOLN COMPARISON:  10/24/2018 FINDINGS: VASCULAR Aorta: Unremarkable course, caliber, contour of the abdominal aorta. No dissection, aneurysm, or periaortic fluid. Mild atherosclerotic changes of the abdominal aorta. Celiac: Patent, with minimal atherosclerotic changes. SMA: Patent, with minimal atherosclerotic changes. Renals: Bilateral renal arteries patent with minimal atherosclerosis and no evidence of high-grade stenosis. IMA: Inferior mesenteric artery is patent. Mild stranding adjacent to the inferior  mesenteric vein, along the pathway of the IMA/IMV bundle. Right lower extremity: Unremarkable course, caliber, and contour of the right iliac system. No aneurysm, dissection, or occlusion. Hypogastric artery is patent. Common femoral artery patent. Proximal SFA and profunda femoris patent. Mild atherosclerotic changes with no high-grade stenosis. Left lower extremity: Unremarkable course, caliber, and contour of the left iliac system. No aneurysm, dissection, or occlusion. Hypogastric artery is patent. Common femoral artery patent. Proximal SFA and profunda femoris patent. Mild atherosclerotic changes with no high-grade stenosis. Veins: IVC and they iliac/pelvic veins unremarkable. Portal vein remains patent. Splenic vein patent, enlarged. Enlarged left gastric vein which contributes to esophageal varices. The current CT demonstrates no evidence of gastric varices. Engorged inferior mesenteric vein. Engorged superior mesenteric vein. Mild edema/stranding within the fat adjacent to the inferior mesenteric vein extending to the pelvis. Mild stranding within the fat adjacent to the ileocolic drainage pathway. Review of the MIP images confirms the above findings. NON-VASCULAR Lower chest: No acute. Hepatobiliary: Cirrhotic morphology of the liver. No focal lesion identified. Unremarkable gall bladder. Pancreas: Unremarkable. Spleen: Diameter of  the spleen measures 12.9 cm. Adrenals/Urinary Tract: Unremarkable appearance of adrenal glands. Right: No hydronephrosis. Symmetric perfusion to the left. No nephrolithiasis. Unremarkable course of the right ureter. Left: No hydronephrosis. Symmetric perfusion to the right. No nephrolithiasis. Unremarkable course of the left ureter. Unremarkable appearance of the urinary bladder . Stomach/Bowel: Esophageal varices. Small hiatal hernia. Unremarkable stomach. Unremarkable appearance of small bowel. Normal appendix. Wall thickening of the cecum with associated stranding in the  adjacent fat. 25 mm low-density focus interposed between the hepatic flexure and segment 6 margin of the liver with Hounsfield units measuring between 20 and 30 on both phases. This appears separate from the colon. Colonic diverticula. No focal inflammatory changes of the sigmoid colon. Lymphatic: No inguinal adenopathy. No pelvic or retroperitoneal adenopathy. Hazy stranding within the retroperitoneal fat, centered in the neurovascular pathway of the SMV and IMV, slightly more pronounced than the comparison CT. Mesenteric: No free air or ascites. Reproductive: Prostatic calcifications. Other: Small fat containing umbilical hernia Musculoskeletal: No displaced fracture. Degenerative changes of the spine. IMPRESSION: CT angiogram does not support chronic mesenteric ischemia as a source of the patient's abdominal pain. There is minimal atherosclerotic change of the mesenteric arteries, which are all patent. Rounded low-density structure between the liver margin of segment 6 and the hepatic flexure, 2.5 cm. 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 Differential includes epiploic appendagitis. Follow-up contrast-enhanced liver protocol CT recommended in 3 months. Cirrhosis with stigmata of portal hypertension. Hazy stranding within the fat adjacent to drainage pathway of the SMV and IMV, favored to represent venous congestion, sequela of the patient's portal hypertension, with portal colopathy. Additional ancillary findings as above. Signed, Dulcy Fanny. Dellia Nims, RPVI [facility] Electronically Signed   By: Corrie Mckusick D.O.   On: 01/18/2019 11:26    Assessment and Plan:   [name] is a 63 y.o. y/o male with ongoing abdominal pain  Assessment and Plan: CTA reviewed with the radiologist, Dr. Vernard Gambles, specifically in regard to the mesenteric infarction mentioned on the CT report.  He  states that the mesenteric infarction is not about the bowel itself, and may have been a fluid collection or fat in that surrounding area.  This may explain the patient's pain  Given his ongoing symptoms for 4 to 5 months and this being the only positive finding, will refer to surgery for further evaluation of this area  No indication for emergent interventions at this time  Patient already has had an EGD and colonoscopy as per HPI  His fecal calprotectin was previously elevated and has improved from before  I did discuss repeat colonoscopy with biopsies if needed if pain does not improve after resolution CT findings seen above   Follow Up Instructions:    I discussed the assessment and treatment plan with the patient. The patient was provided an opportunity to ask questions and all were answered. The patient agreed with the plan and demonstrated an understanding of the instructions.   The patient was advised to call back or seek an in-person evaluation if the symptoms worsen or if the condition fails to improve as anticipated.  I provided 11 minutes of non-face-to-face time during this encounter. Additional time was spent in reviewing patient's chart, placing orders etc.   Virgel Manifold, MD  Speech recognition software was used to dictate this note.

## 2019-01-29 ENCOUNTER — Other Ambulatory Visit: Payer: Self-pay

## 2019-01-29 ENCOUNTER — Ambulatory Visit (INDEPENDENT_AMBULATORY_CARE_PROVIDER_SITE_OTHER): Payer: Medicaid Other | Admitting: Surgery

## 2019-01-29 ENCOUNTER — Encounter: Payer: Self-pay | Admitting: Surgery

## 2019-01-29 VITALS — BP 178/91 | HR 73 | Temp 97.9°F | Ht 70.0 in | Wt 187.2 lb

## 2019-01-29 DIAGNOSIS — R9389 Abnormal findings on diagnostic imaging of other specified body structures: Secondary | ICD-10-CM

## 2019-01-29 NOTE — Progress Notes (Signed)
01/29/2019  Reason for Visit:  Abnormal CT scan finding  Referring Provider:  [name], MD  History of Present Illness: [name] is a 63 y.o. male presenting for evaluation of an abnormal CT scan finding.  The patient has a history of 5 months of lower abdominal pain, associated with multiple bouts of diarrhea daily.  He has a history of hepatitis and has undergone medical treatment for it.  He reports that the pain is mostly in lower abdomen, bilaterally, but it also "rolls" or moves around his abdomen, with at times some discomfort in the upper abdomen.  He feels that sometimes the pain is better after bowel movement, but he's uncertain of this.  PO intake does not necessarily make it worse, but he has noted sometimes it does.  He denies any blood in the stool, but he feels he has to strain for his diarrhea bouts.  He reports the diarrhea is somewhat improving recently, with the stool being mushy and with less frequent episodes, but he still reports about 10 movements per day.  He has had an ultrasound and two CT scans.  His first CT scan was on 9/23 which did not find any acute findings except for liver cirrhosis and splenomegaly and changes from portal hypertension.  His CT on 12/18 was done with concerns for possible mesenteric ischemia, but it was negative for mesenteric ischemia, with patent celiac, SMA, and IMA.  However, it did find an area of possible fluid collection vs epiploic appendagitis vs mesenteric infarction in the right upper quadrant between liver margin of segment 6 and the hepatic flexure.  In light of this, he was referred to [place] for further evaluation.  Past Medical History: Past Medical History:  Diagnosis Date  . Anxiety   . Diabetes mellitus without complication ([place])   . GERD (gastroesophageal reflux disease)   . High cholesterol   . Hypertension      Past Surgical History: Past Surgical History:  Procedure Laterality Date  . COLONOSCOPY WITH  PROPOFOL N/A 10/03/2018   Procedure: COLONOSCOPY WITH PROPOFOL;  Surgeon: Virgel Manifold, MD;  Location: [facility] ENDOSCOPY;  Service: Endoscopy;  Laterality: N/A;  . ELBOW SURGERY    . ESOPHAGOGASTRODUODENOSCOPY (EGD) WITH PROPOFOL N/A 10/03/2018   Procedure: ESOPHAGOGASTRODUODENOSCOPY (EGD) WITH PROPOFOL;  Surgeon: Virgel Manifold, MD;  Location: [facility] ENDOSCOPY;  Service: Endoscopy;  Laterality: N/A;    Home Medications: Prior to Admission medications   Medication Sig Start Date End Date Taking? Authorizing Provider  atorvastatin (LIPITOR) 40 MG tablet Take 40 mg by mouth daily.   Yes [provider]  lisinopril (ZESTRIL) 20 MG tablet Take 20 mg by mouth daily.   Yes [provider]  loperamide (IMODIUM A-D) 2 MG tablet Take 1 tablet (2 mg total) by mouth 4 (four) times daily as needed for diarrhea or loose stools. 12/04/18  Yes Virgel Manifold, MD  metFORMIN (GLUCOPHAGE) 1000 MG tablet Take 1,000 mg by mouth 2 (two) times daily with a meal.   Yes [provider]  canagliflozin (INVOKANA) 100 MG TABS tablet Take 100 mg by mouth daily before breakfast.    [provider]  carvedilol (COREG) 6.25 MG tablet Take 1 tablet (6.25 mg total) by mouth daily. 10/15/18 01/22/19  Virgel Manifold, MD    Allergies: No Known Allergies  Social History:  reports that he has been smoking cigarettes. He has never used smokeless tobacco. He reports previous alcohol use. He reports that he does not  use drugs.   Family History: History reviewed. No pertinent family history.  Review of Systems: Review of Systems  Constitutional: Negative for chills and fever.  HENT: Negative for hearing loss.   Respiratory: Negative for shortness of breath.   Cardiovascular: Negative for chest pain.  Gastrointestinal: Positive for abdominal pain and diarrhea. Negative for blood in stool, constipation, nausea and vomiting.  Genitourinary: Negative for dysuria.   Musculoskeletal: Negative for myalgias.  Skin: Negative for rash.  Neurological: Negative for dizziness.  Psychiatric/Behavioral: Negative for depression.    Physical Exam BP (!) 178/91   Pulse 73   Temp 97.9 F (36.6 C) (Temporal)   Ht 5\' 10"  (1.778 m)   Wt 84.9 kg   SpO2 94%   BMI 26.86 kg/m  CONSTITUTIONAL: No acute distress. HEENT:  Normocephalic, atraumatic, extraocular motion intact. NECK: Trachea is midline, and there is no jugular venous distension.  RESPIRATORY:  Lungs are clear, and breath sounds are equal bilaterally. Normal respiratory effort without pathologic use of accessory muscles. CARDIOVASCULAR: Heart is regular without murmurs, gallops, or rubs. GI: The abdomen is soft, non-distended, with some tenderness to palpation in the lower abdomen, particularly in right lower and left lower quadrants.  However, there is also some soreness when pressing in the epigastric area.  No tenderness in the right upper quadrant at this time.  MUSCULOSKELETAL:  Normal muscle strength and tone in all four extremities.  No peripheral edema or cyanosis. SKIN: Skin turgor is normal. There are no pathologic skin lesions.  NEUROLOGIC:  Motor and sensation is grossly normal.  Cranial nerves are grossly intact. PSYCH:  Alert and oriented to person, place and time. Affect is normal.  Laboratory Analysis: No results found for this or any previous visit (from the past 24 hour(s)).  Imaging: CT scan abd/pelvis 10/24/18: IMPRESSION: 1. No acute CT findings of the abdomen or pelvis to explain lower abdominal pain. No evidence of enteritis or colitis. 2. Stigmata of cirrhosis and splenomegaly. No focal liver lesions appreciated on portal venous phase of contrast. 3.  Prostatomegaly. 4.  Aortic Atherosclerosis   CTA abd/pelvis 01/18/19: IMPRESSION: CT angiogram does not support chronic mesenteric ischemia as a source of the patient's abdominal pain. There is minimal atherosclerotic change of  the mesenteric arteries, which are all patent.  Rounded low-density structure between the liver margin of segment 6 and the hepatic flexure, 2.5 cm. 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 Differential includes epiploic appendagitis. Follow-up contrast- enhanced liver protocol CT recommended in 3 months.  Cirrhosis with stigmata of portal hypertension.  Hazy stranding within the fat adjacent to drainage pathway of the SMV and IMV, favored to represent venous congestion, sequela of the patient's portal hypertension, with portal colopathy.  Assessment and Plan: This is a 62 y.o. male with lower abdominal pain and diarrhea.  --Discussed with the patient that it is unlikely that this new finding on CT scan in the right upper quadrant would be the source of his symptoms.  First, the symptoms preceded the first CT scan of 9/23 which did not show those findings.  Also, this area would contribute more to RUQ pain, rather than bilateral lower quadrant pain.  It is unlikely that it would also contribute to diarrhea.  Unfortunately, I do not know the etiology of this new finding at this point.  I do not think that antibiotics would be needed either, as this CT scan was 9 days ago, and I  would imagine an infectious source would only be getting worse. --Though less likely, I am wondering if some of the abdominal discomfort is from muscle spasms from all the straining he reports with his bowel movements.  Discussed with him that if the diarrhea is contributing to crampy pain, perhaps working on the diarrhea could help.  Recommended that he start taking fiber supplements with Metamucil or Benefiber once or twice a day to help bulk the stool better.  He should also stay hydrated. --Radiology recommends CT scan with liver protocol for follow up in 3 months.  I think that is reasonable to evaluate this area better.  We will order this CT scan and  have him follow up in 3 months to check on his progress.  In the meantime, recommended that he should continue follow up and workup with Dr. Bonna Gains.   --Patient understands this plan and all of his questions have been answered.   Face-to-face time spent with the patient and care providers was 40 minutes, with more than 50% of the time spent counseling, educating, and coordinating care of the patient.     Melvyn Neth, [facility]

## 2019-01-29 NOTE — Patient Instructions (Addendum)
Dr.Piscoya recommended patient to increase Fiber in his diet to help with his stools, pain and discomfort.   Patient is also recommended to try over the counter Metamucil with his diet as well.   Patient may try eating some fruits to help with his electrolytes and patient may try Bananas to help with Potassium.

## 2019-02-25 ENCOUNTER — Other Ambulatory Visit: Payer: Self-pay

## 2019-02-25 DIAGNOSIS — R9389 Abnormal findings on diagnostic imaging of other specified body structures: Secondary | ICD-10-CM

## 2019-02-25 DIAGNOSIS — R933 Abnormal findings on diagnostic imaging of other parts of digestive tract: Secondary | ICD-10-CM

## 2019-04-15 ENCOUNTER — Ambulatory Visit: Admission: RE | Admit: 2019-04-15 | Payer: Medicaid Other | Source: Ambulatory Visit

## 2019-04-26 ENCOUNTER — Ambulatory Visit: Payer: Medicaid Other | Admitting: Surgery

## 2019-05-10 ENCOUNTER — Ambulatory Visit: Payer: Medicaid Other | Admitting: Surgery

## 2019-05-20 ENCOUNTER — Ambulatory Visit
Admission: RE | Admit: 2019-05-20 | Discharge: 2019-05-20 | Disposition: A | Discharge status: Home / Self Care | Payer: Medicaid Other | Source: Ambulatory Visit | Attending: Surgery | Admitting: Surgery

## 2019-05-20 ENCOUNTER — Other Ambulatory Visit: Payer: Self-pay

## 2019-05-20 DIAGNOSIS — R9389 Abnormal findings on diagnostic imaging of other specified body structures: Secondary | ICD-10-CM | POA: Diagnosis present

## 2019-05-20 DIAGNOSIS — R933 Abnormal findings on diagnostic imaging of other parts of digestive tract: Secondary | ICD-10-CM | POA: Insufficient documentation

## 2019-05-20 LAB — POCT I-STAT CREATININE: Creatinine, Ser: 1 mg/dL (ref 0.61–1.24)

## 2019-05-20 MED ORDER — IOHEXOL 300 MG/ML  SOLN
100.0000 mL | Freq: Once | INTRAMUSCULAR | Status: AC | PRN
  Administered 2019-05-20: 100 mL via INTRAVENOUS

## 2019-05-21 ENCOUNTER — Other Ambulatory Visit: Payer: Self-pay

## 2019-05-21 ENCOUNTER — Encounter: Payer: Self-pay | Admitting: Surgery

## 2019-05-21 DIAGNOSIS — R1900 Intra-abdominal and pelvic swelling, mass and lump, unspecified site: Secondary | ICD-10-CM

## 2019-05-21 DIAGNOSIS — R160 Hepatomegaly, not elsewhere classified: Secondary | ICD-10-CM

## 2019-05-21 DIAGNOSIS — R933 Abnormal findings on diagnostic imaging of other parts of digestive tract: Secondary | ICD-10-CM

## 2019-05-21 NOTE — H&P (Signed)
Date:  05/21/2019  History of Present Illness: [name] is a 64 y.o. male with a history of abdominal pain.  He has been evaluated by Dr. Bonna Gains with GI and has had different imaging studies as part of his workup.  He had a CTA of abdomen and pelvis to evaluate for mesenteric ischemia on 01/18/19.  This showed a rounded low-density structure between the liver margin of segment 6 and the colon hepatic flexure, thought to be a mesenteric infarction or epiploic appendagitis.  A follow up CT was recommended in three months' time.  This CT was done on 05/20/19, and it showed a large poorly defined infiltrative mass within segment 6 and 7, contiguous with a small peripherally enhancing fluid collection adjacent to the inferior margin of the liver.  This was suspicious for either neoplasm vs abscess.  Radiology recommended contrast enhanced liver protocol MRI for further evaluation of this area.  I discussed the results of the CT scan with the patient and an MRI will be scheduled.  However, the patient reports he has severe claustrophobia and would like the MRI to be done under anesthesia.  Patient denies any fevers or chills.  Continues to have abdominal pain, and currently it's in the upper abdomen, with some discomfort when he takes a deep breath.  Of note, he has a history of cirrhosis, Hepatitis C s/p prior treatment, evidence of portal hypertension.  Past Medical History: Past Medical History:  Diagnosis Date  . Anxiety   . Diabetes mellitus without complication ([place])   . GERD (gastroesophageal reflux disease)   . High cholesterol   . Hypertension      Past Surgical History: Past Surgical History:  Procedure Laterality Date  . COLONOSCOPY WITH PROPOFOL N/A 10/03/2018   Procedure: COLONOSCOPY WITH PROPOFOL;  Surgeon: Virgel Manifold, MD;  Location: [facility] ENDOSCOPY;  Service: Endoscopy;  Laterality: N/A;  . ELBOW SURGERY    . ESOPHAGOGASTRODUODENOSCOPY (EGD) WITH PROPOFOL N/A 10/03/2018    Procedure: ESOPHAGOGASTRODUODENOSCOPY (EGD) WITH PROPOFOL;  Surgeon: Virgel Manifold, MD;  Location: [facility] ENDOSCOPY;  Service: Endoscopy;  Laterality: N/A;    Home Medications: Prior to Admission medications   Medication Sig Start Date End Date Taking? Authorizing Provider  atorvastatin (LIPITOR) 40 MG tablet Take 40 mg by mouth daily.    [provider]  canagliflozin (INVOKANA) 100 MG TABS tablet Take 100 mg by mouth daily before breakfast.    [provider]  carvedilol (COREG) 6.25 MG tablet Take 1 tablet (6.25 mg total) by mouth daily. 10/15/18 01/22/19  Virgel Manifold, MD  lisinopril (ZESTRIL) 20 MG tablet Take 20 mg by mouth daily.    [provider]  loperamide (IMODIUM A-D) 2 MG tablet Take 1 tablet (2 mg total) by mouth 4 (four) times daily as needed for diarrhea or loose stools. 12/04/18   Virgel Manifold, MD  metFORMIN (GLUCOPHAGE) 1000 MG tablet Take 1,000 mg by mouth 2 (two) times daily with a meal.    [provider]    Allergies: No Known Allergies  Social History:  reports that he has been smoking cigarettes. He has never used smokeless tobacco. He reports previous alcohol use. He reports that he does not use drugs.   Family History: No family history on file.  Review of Systems: Review of Systems  Constitutional: Negative for chills and fever.  HENT: Negative for hearing loss.   Respiratory: Negative for shortness of breath.   Cardiovascular: Negative for chest pain.  Gastrointestinal:  Positive for abdominal pain. Negative for diarrhea, nausea and vomiting.  Musculoskeletal: Positive for myalgias.  Skin: Negative for rash.  Neurological: Negative for dizziness.  Psychiatric/Behavioral: Negative for depression.    Physical Exam CONSTITUTIONAL: No acute distress HEENT:  Normocephalic, atraumatic, extraocular motion intact. NECK: Trachea is midline, and there is no jugular venous distension.  RESPIRATORY:   Lungs are clear, and breath sounds are equal bilaterally. Normal respiratory effort without pathologic use of accessory muscles. CARDIOVASCULAR: Heart is regular without murmurs, gallops, or rubs. GI: The abdomen is soft, non-distended, with discomfort in the upper abdomen. MUSCULOSKELETAL:  Normal muscle strength and tone in all four extremities.  No peripheral edema or cyanosis. SKIN: Skin turgor is normal. There are no pathologic skin lesions.  NEUROLOGIC:  Motor and sensation is grossly normal.  Cranial nerves are grossly intact. PSYCH:  Alert and oriented to person, place and time. Affect is normal.  Laboratory Analysis: No results found for this or any previous visit (from the past 24 hour(s)).  Imaging: IMPRESSION: 1. Morphologic changes of cirrhosis and portal venous hypertension. 2. There is a large poorly defined, infiltrative mass within segment 6 and segment 7 with areas of arterial phase enhancement. This mass is contiguous with a small peripherally enhancing fluid collection adjacent to the inferior margin of the liver. There are progressive inflammatory changes with soft tissue stranding identified. Primary differential considerations include infectious process with liver abscess versus neoplasm in this patient who may be at increased risk for hepatocellular carcinoma. Further evaluation with contrast enhanced liver protocol MRI is advised. Pending MRI findings needle aspiration and/or core biopsy may be indicated. Additionally, correlation with alpha fetoprotein levels is recommended. 3. Stigmata of portal venous hypertension identified including splenomegaly and esophageal varices. 4. Aortic atherosclerosis.  Assessment and Plan: This is a 64 y.o. male with abdominal pain, and recent workup revealing a possible large mass of the right hepatic lobe, segments 6 and 7.    --Discussed with the patient that in order to better evaluate the mass/infectious process, we would need an  MRI of the liver.  Based on the results, we would either need a biopsy if it's a mass, or drainage if it's an abscess. --Patient wishes to have the MRI under anesthesia due to the severe claustrophobia he experiences.  Valium in the past has not worked per his report.  Discussed with him that we'll do what we can to accommodate his request. --Will contact him with the results of the MRI and further plan of action.   Melvyn Neth, MD [facility] Pg:  [phone]

## 2019-05-21 NOTE — Progress Notes (Signed)
05/21/19  Called patient this morning to discuss the results of his CT scan from 05/20/19.  The CT shows a potential mass vs infectious process in the right liver lobe, segments 6 and 7, 7.8 x 7 cm.  This was not seen on the previous CT scan on 01/18/19.  I have discussed with Dr. Bonna Gains, and we'll order a contrast enhanced liver protocol MRI to better evaluate this area.  The patient reports that he is severely claustrophobic and would like to do the MRI under anesthesia.  We'll place the order as such.  Patient understands this plan and all of his questions have been answered.  Olean Ree, MD

## 2019-05-30 ENCOUNTER — Telehealth: Payer: Self-pay | Admitting: *Deleted

## 2019-05-30 NOTE — Telephone Encounter (Signed)
Jans telephone number is [phone]

## 2019-05-30 NOTE — Progress Notes (Addendum)
I called Dr. Mont Dutton office and asked for a H/P that is within 30 days of surgery. Patient will have to be seen. Results will be in Epic. I reviewed a call from Dr. Hampton Abbot pointing me to the H/P that was done on 05/21/2019, found under notes.

## 2019-05-30 NOTE — Telephone Encounter (Signed)
Jan from [facility] called in regards to this patient. We have him scheduled for a MRI on 06/06/19 and she stated that they need a updated H&P

## 2019-06-03 ENCOUNTER — Other Ambulatory Visit (HOSPITAL_COMMUNITY)
Admission: RE | Admit: 2019-06-03 | Discharge: 2019-06-03 | Disposition: A | Discharge status: Home / Self Care | Payer: Medicaid Other | Source: Ambulatory Visit | Attending: Surgery | Admitting: Surgery

## 2019-06-03 DIAGNOSIS — Z01812 Encounter for preprocedural laboratory examination: Secondary | ICD-10-CM | POA: Diagnosis not present

## 2019-06-03 DIAGNOSIS — Z20822 Contact with and (suspected) exposure to covid-19: Secondary | ICD-10-CM | POA: Insufficient documentation

## 2019-06-03 LAB — SARS CORONAVIRUS 2 (TAT 6-24 HRS): SARS Coronavirus 2: NEGATIVE

## 2019-06-05 ENCOUNTER — Encounter (HOSPITAL_COMMUNITY): Payer: Self-pay | Admitting: *Deleted

## 2019-06-05 NOTE — Progress Notes (Signed)
Anesthesia Chart Review: Kathleene Hazel    Case: [id number] Date/Time: 06/06/19 0800   Procedure: MRI ADOMEN WITH AND WITHOUT CONTRAST (N/A )   Anesthesia type: General   Pre-op diagnosis: LIVER MASS   Location: [facility] OR RADIOLOGY ROOM / [place]   Surgeons: Radiologist, Medication, MD      DISCUSSION: Patient is a 64 year old male scheduled for an MRI of the abdomen under anesthesia. He has known cirrhosis and recent CT with liver mass. Imaging ordered by Olean Ree, MD. H&P on 05/21/19.   Other history includes smoking, DM2, HTN, hypercholesterolemia, GERD, Hepatitis C (diagnosed 2017, s/p Mavyret therapy 2020), cirrhosis (due to hepatitis C, ETOH), anxiety.  Preprocedure COVID-19 test negative on 06/03/2019.  Anesthesia team to evaluate on the day of procedure. Currently no EKG seen on file and last labs > 14 days.    VS: For day of surgery. BP Readings from Last 3 Encounters:  01/29/19 (!) 178/91  01/17/19 (!) 169/96  11/26/18 133/81   Pulse Readings from Last 3 Encounters:  01/29/19 73  01/17/19 68  11/26/18 67    PROVIDERS: Gennette Pac, FNP is PCP [facility]) [name], MD is GI   LABS: On day of procedure. Cr 1.00 on 05/20/19. AST 75, ALT 48, glucose 114 on 11/26/18.    IMAGES: CT abd 05/20/19: IMPRESSION: 1. Morphologic changes of cirrhosis and portal venous hypertension. 2. There is a large poorly defined, infiltrative mass within segment 6 and segment 7 with areas of arterial phase enhancement. This mass is contiguous with a small peripherally enhancing fluid collection adjacent to the inferior margin of the liver. There are progressive inflammatory changes with soft tissue stranding identified. Primary differential considerations include infectious process with liver abscess versus neoplasm in this patient who may be at increased risk for hepatocellular carcinoma. Further evaluation with contrast enhanced liver protocol MRI is  advised. Pending MRI findings needle aspiration and/or core biopsy may be indicated. Additionally, correlation with alpha fetoprotein levels is recommended. 3. Stigmata of portal venous hypertension identified including splenomegaly and esophageal varices. 4. Aortic atherosclerosis.   EKG: Currently no EKG tracing available.    CV: N/A   Past Medical History:  Diagnosis Date  . Anxiety   . Cirrhosis ([place])   . Diabetes mellitus without complication ([place])   . GERD (gastroesophageal reflux disease)   . High cholesterol   . HLD (hyperlipidemia)   . Hypertension     Past Surgical History:  Procedure Laterality Date  . COLONOSCOPY WITH PROPOFOL N/A 10/03/2018   Procedure: COLONOSCOPY WITH PROPOFOL;  Surgeon: Virgel Manifold, MD;  Location: [facility] ENDOSCOPY;  Service: Endoscopy;  Laterality: N/A;  . ELBOW SURGERY    . ESOPHAGOGASTRODUODENOSCOPY (EGD) WITH PROPOFOL N/A 10/03/2018   Procedure: ESOPHAGOGASTRODUODENOSCOPY (EGD) WITH PROPOFOL;  Surgeon: Virgel Manifold, MD;  Location: [facility] ENDOSCOPY;  Service: Endoscopy;  Laterality: N/A;    MEDICATIONS: . atorvastatin (LIPITOR) 40 MG tablet  . carvedilol (COREG) 6.25 MG tablet  . glipiZIDE (GLUCOTROL) 10 MG tablet  . lisinopril (ZESTRIL) 20 MG tablet  . loperamide (IMODIUM A-D) 2 MG tablet  . metFORMIN (GLUCOPHAGE) 1000 MG tablet   No current facility-administered medications for this encounter.  Not taking loperamide.   Myra Gianotti, PA-C Surgical Short Stay/Anesthesiology [facility] Phone [phone] [facility] Phone [phone] 06/05/2019 12:08 PM

## 2019-06-05 NOTE — Anesthesia Preprocedure Evaluation (Addendum)
Anesthesia Evaluation  Patient identified by MRN, date of birth, ID band Patient awake    Reviewed: Allergy & Precautions, H&P , NPO status , Patient's Chart, lab work & pertinent test results  Airway Mallampati: II   Neck ROM: full    Dental   Pulmonary Current Smoker,    breath sounds clear to auscultation       Cardiovascular hypertension,  Rhythm:regular Rate:Normal     Neuro/Psych PSYCHIATRIC DISORDERS Anxiety    GI/Hepatic GERD  ,(+) Cirrhosis       , Hepatitis -, CEsophageal varices   Endo/Other  diabetes, Type 2  Renal/GU      Musculoskeletal   Abdominal   Peds  Hematology   Anesthesia Other Findings   Reproductive/Obstetrics                            Anesthesia Physical Anesthesia Plan  ASA: III  Anesthesia Plan: General   Post-op Pain Management:    Induction: Intravenous  PONV Risk Score and Plan: 1 and Ondansetron, Dexamethasone and Treatment may vary due to age or medical condition  Airway Management Planned: Oral ETT  Additional Equipment:   Intra-op Plan:   Post-operative Plan: Extubation in OR  Informed Consent: I have reviewed the patients History and Physical, chart, labs and discussed the procedure including the risks, benefits and alternatives for the proposed anesthesia with the patient or authorized representative who has indicated his/her understanding and acceptance.       Plan Discussed with: CRNA, Anesthesiologist and Surgeon  Anesthesia Plan Comments: (PAT note written 06/05/2019 by Myra Gianotti, PA-C. SAME DAY WORK-UP. History cirrhosis (alcohol, Hepatitis C now treated), DM2, HTN, smoking.   )       Anesthesia Quick Evaluation

## 2019-06-05 NOTE — Progress Notes (Signed)
Patient denies shortness of breath, fever, cough and chest pain.  PCP - Gennette Pac, FNP-C Cardiologist - n/s  Chest x-ray - n/a EKG - DOS 06/06/19 Stress Test - n/a ECHO - n/a Cardiac Cath - n/a  Anesthesia review: Yes  Fasting Blood Sugar - 200s Checks Blood Sugar _1-2_ times a day  . Do not take oral diabetes medicines (metformin,glipizide) the morning of surgery.  STOP now taking any Aspirin (unless otherwise instructed by your surgeon), Aleve, Naproxen, Ibuprofen, Motrin, Advil, Goody's, BC's, all herbal medications, fish oil, and all vitamins.   Coronavirus Screening Covid test on 06/03/19 was negative.  Patient verbalized understanding of instructions that were given via phone.

## 2019-06-06 ENCOUNTER — Encounter (HOSPITAL_COMMUNITY): Payer: Self-pay

## 2019-06-06 ENCOUNTER — Other Ambulatory Visit: Payer: Self-pay | Admitting: Surgery

## 2019-06-06 ENCOUNTER — Encounter (HOSPITAL_COMMUNITY): Admission: RE | Disposition: A | Discharge status: Home / Self Care | Payer: Self-pay | Source: Home / Self Care

## 2019-06-06 ENCOUNTER — Ambulatory Visit (HOSPITAL_COMMUNITY): Payer: Medicaid Other | Admitting: Vascular Surgery

## 2019-06-06 ENCOUNTER — Ambulatory Visit (HOSPITAL_COMMUNITY): Payer: Medicaid Other | Admitting: Anesthesiology

## 2019-06-06 ENCOUNTER — Other Ambulatory Visit: Payer: Self-pay

## 2019-06-06 ENCOUNTER — Ambulatory Visit (HOSPITAL_COMMUNITY)
Admission: RE | Admit: 2019-06-06 | Discharge: 2019-06-06 | Disposition: A | Discharge status: Home / Self Care | Payer: Medicaid Other | Attending: Surgery | Admitting: Surgery

## 2019-06-06 ENCOUNTER — Ambulatory Visit (HOSPITAL_COMMUNITY)
Admission: RE | Admit: 2019-06-06 | Discharge: 2019-06-06 | Disposition: A | Discharge status: Home / Self Care | Payer: Medicaid Other | Source: Ambulatory Visit | Attending: Surgery | Admitting: Surgery

## 2019-06-06 DIAGNOSIS — Z7984 Long term (current) use of oral hypoglycemic drugs: Secondary | ICD-10-CM | POA: Diagnosis not present

## 2019-06-06 DIAGNOSIS — F40240 Claustrophobia: Secondary | ICD-10-CM | POA: Insufficient documentation

## 2019-06-06 DIAGNOSIS — F17210 Nicotine dependence, cigarettes, uncomplicated: Secondary | ICD-10-CM | POA: Insufficient documentation

## 2019-06-06 DIAGNOSIS — I10 Essential (primary) hypertension: Secondary | ICD-10-CM | POA: Insufficient documentation

## 2019-06-06 DIAGNOSIS — I8510 Secondary esophageal varices without bleeding: Secondary | ICD-10-CM | POA: Insufficient documentation

## 2019-06-06 DIAGNOSIS — K7460 Unspecified cirrhosis of liver: Secondary | ICD-10-CM | POA: Insufficient documentation

## 2019-06-06 DIAGNOSIS — I700 Atherosclerosis of aorta: Secondary | ICD-10-CM | POA: Insufficient documentation

## 2019-06-06 DIAGNOSIS — R109 Unspecified abdominal pain: Secondary | ICD-10-CM | POA: Insufficient documentation

## 2019-06-06 DIAGNOSIS — R1900 Intra-abdominal and pelvic swelling, mass and lump, unspecified site: Secondary | ICD-10-CM

## 2019-06-06 DIAGNOSIS — F419 Anxiety disorder, unspecified: Secondary | ICD-10-CM | POA: Diagnosis not present

## 2019-06-06 DIAGNOSIS — Z79899 Other long term (current) drug therapy: Secondary | ICD-10-CM | POA: Diagnosis not present

## 2019-06-06 DIAGNOSIS — K219 Gastro-esophageal reflux disease without esophagitis: Secondary | ICD-10-CM | POA: Insufficient documentation

## 2019-06-06 DIAGNOSIS — E119 Type 2 diabetes mellitus without complications: Secondary | ICD-10-CM | POA: Insufficient documentation

## 2019-06-06 DIAGNOSIS — R160 Hepatomegaly, not elsewhere classified: Secondary | ICD-10-CM

## 2019-06-06 DIAGNOSIS — R933 Abnormal findings on diagnostic imaging of other parts of digestive tract: Secondary | ICD-10-CM

## 2019-06-06 DIAGNOSIS — B1920 Unspecified viral hepatitis C without hepatic coma: Secondary | ICD-10-CM | POA: Insufficient documentation

## 2019-06-06 DIAGNOSIS — E7800 Pure hypercholesterolemia, unspecified: Secondary | ICD-10-CM | POA: Insufficient documentation

## 2019-06-06 HISTORY — DX: Unspecified cirrhosis of liver: K74.60

## 2019-06-06 HISTORY — DX: Hyperlipidemia, unspecified: E78.5

## 2019-06-06 HISTORY — DX: Inflammatory liver disease, unspecified: K75.9

## 2019-06-06 HISTORY — PX: RADIOLOGY WITH ANESTHESIA: SHX6223

## 2019-06-06 LAB — CBC
HCT: 37.3 % — ABNORMAL LOW (ref 39.0–52.0)
Hemoglobin: 12.4 g/dL — ABNORMAL LOW (ref 13.0–17.0)
MCH: 32.4 pg (ref 26.0–34.0)
MCHC: 33.2 g/dL (ref 30.0–36.0)
MCV: 97.4 fL (ref 80.0–100.0)
Platelets: 120 10*3/uL — ABNORMAL LOW (ref 150–400)
RBC: 3.83 MIL/uL — ABNORMAL LOW (ref 4.22–5.81)
RDW: 13.4 % (ref 11.5–15.5)
WBC: 4.1 10*3/uL (ref 4.0–10.5)
nRBC: 0 % (ref 0.0–0.2)

## 2019-06-06 LAB — COMPREHENSIVE METABOLIC PANEL
ALT: 25 U/L (ref 0–44)
AST: 36 U/L (ref 15–41)
Albumin: 3.4 g/dL — ABNORMAL LOW (ref 3.5–5.0)
Alkaline Phosphatase: 83 U/L (ref 38–126)
Anion gap: 10 (ref 5–15)
BUN: 20 mg/dL (ref 8–23)
CO2: 25 mmol/L (ref 22–32)
Calcium: 9.4 mg/dL (ref 8.9–10.3)
Chloride: 103 mmol/L (ref 98–111)
Creatinine, Ser: 0.79 mg/dL (ref 0.61–1.24)
GFR calc Af Amer: >60 mL/min (ref >60)
GFR calc non Af Amer: >60 mL/min (ref >60)
Glucose, Bld: 200 mg/dL — ABNORMAL HIGH (ref 70–99)
Potassium: 4.2 mmol/L (ref 3.5–5.1)
Sodium: 138 mmol/L (ref 135–145)
Total Bilirubin: 0.6 mg/dL (ref 0.3–1.2)
Total Protein: 8.3 g/dL — ABNORMAL HIGH (ref 6.5–8.1)

## 2019-06-06 LAB — GLUCOSE, CAPILLARY
Glucose-Capillary: 193 mg/dL — ABNORMAL HIGH (ref 70–99)
Glucose-Capillary: 187 mg/dL — ABNORMAL HIGH (ref 70–99)

## 2019-06-06 SURGERY — MRI WITH ANESTHESIA
Anesthesia: General

## 2019-06-06 MED ORDER — ONDANSETRON HCL 4 MG/2ML IJ SOLN: 4.0000 mg | Freq: Four times a day (QID) | INTRAMUSCULAR | Status: DC | PRN

## 2019-06-06 MED ORDER — LACTATED RINGERS IV SOLN: INTRAVENOUS | Status: DC

## 2019-06-06 MED ORDER — OXYCODONE HCL 5 MG PO TABS: 5.0000 mg | ORAL_TABLET | Freq: Once | ORAL | Status: DC | PRN

## 2019-06-06 MED ORDER — ORAL CARE MOUTH RINSE: 15.0000 mL | Freq: Once | OROMUCOSAL | Status: DC

## 2019-06-06 MED ORDER — OXYCODONE HCL 5 MG/5ML PO SOLN: 5.0000 mg | Freq: Once | ORAL | Status: DC | PRN

## 2019-06-06 MED ORDER — FENTANYL CITRATE (PF) 100 MCG/2ML IJ SOLN: 25.0000 ug | INTRAMUSCULAR | Status: DC | PRN

## 2019-06-06 MED ORDER — GADOBUTROL 1 MMOL/ML IV SOLN
9.0000 mL | Freq: Once | INTRAVENOUS | Status: AC | PRN
  Administered 2019-06-06: 9 mL via INTRAVENOUS

## 2019-06-06 MED ORDER — CHLORHEXIDINE GLUCONATE 0.12 % MT SOLN: 15.0000 mL | Freq: Once | OROMUCOSAL | Status: DC

## 2019-06-06 NOTE — Anesthesia Postprocedure Evaluation (Signed)
Anesthesia Post Note  Patient: [name]  Procedure(s) Performed: MRI ADOMEN WITH AND WITHOUT CONTRAST (N/A )     Patient location during evaluation: PACU Anesthesia Type: General Level of consciousness: awake and alert Pain management: pain level controlled Vital Signs Assessment: post-procedure vital signs reviewed and stable Respiratory status: spontaneous breathing, nonlabored ventilation, respiratory function stable and patient connected to nasal cannula oxygen Cardiovascular status: blood pressure returned to baseline and stable Postop Assessment: no apparent nausea or vomiting Anesthetic complications: no    Last Vitals:  Vitals:   06/06/19 0958 06/06/19 1000  BP:  (!) 150/88  Pulse: 71 73  Resp: 17 16  Temp:  36.4 C  SpO2: 99% 96%    Last Pain:  Vitals:   06/06/19 1000  TempSrc:   PainSc: 0-No pain                 Shine Mikes S

## 2019-06-06 NOTE — Anesthesia Procedure Notes (Signed)
Procedure Name: Intubation Date/Time: 06/06/2019 8:28 AM Performed by: Mariea Clonts, CRNA Pre-anesthesia Checklist: Patient identified, Emergency Drugs available, Suction available and Patient being monitored Patient Re-evaluated:Patient Re-evaluated prior to induction Oxygen Delivery Method: Circle System Utilized Preoxygenation: Pre-oxygenation with 100% oxygen Induction Type: IV induction Ventilation: Mask ventilation without difficulty Laryngoscope Size: Miller and 2 Grade View: Grade II Tube type: Oral Tube size: 7.5 mm Number of attempts: 1 Airway Equipment and Method: Stylet and Oral airway Placement Confirmation: ETT inserted through vocal cords under direct vision,  positive ETCO2 and breath sounds checked- equal and bilateral Tube secured with: Tape Dental Injury: Teeth and Oropharynx as per pre-operative assessment

## 2019-06-06 NOTE — Progress Notes (Signed)
   06/06/19 [id number]  OBSTRUCTIVE SLEEP APNEA  Have you ever been diagnosed with sleep apnea through a sleep study? No  Do you snore loudly (loud enough to be heard through closed doors)?  1  Do you often feel tired, fatigued, or sleepy during the daytime (such as falling asleep during driving or talking to someone)? 0  Has anyone observed you stop breathing during your sleep? 1  Do you have, or are you being treated for high blood pressure? 1  BMI more than 35 kg/m2? 0  Age > 50 (1-yes) 1  Neck circumference greater than:Male 16 inches or larger, Male 17inches or larger? 0  Male Gender (Yes=1) 1  Obstructive Sleep Apnea Score 5  Score 5 or greater  Results sent to PCP

## 2019-06-06 NOTE — Progress Notes (Signed)
Urgent referral has been sent to [facility]. Images have been Power Shared. PH: [phone] Fax: [phone]

## 2019-06-06 NOTE — Transfer of Care (Signed)
Immediate Anesthesia Transfer of Care Note  Patient: [name]  Procedure(s) Performed: MRI ADOMEN WITH AND WITHOUT CONTRAST (N/A )  Patient Location: PACU  Anesthesia Type:General  Level of Consciousness: awake, alert  and oriented  Airway & Oxygen Therapy: Patient Spontanous Breathing and Patient connected to nasal cannula oxygen  Post-op Assessment: Report given to RN and Post -op Vital signs reviewed and stable  Post vital signs: Reviewed and stable  Last Vitals:  Vitals Value Taken Time  BP    Temp    Pulse    Resp    SpO2      Last Pain:  Vitals:   06/06/19 0633  TempSrc:   PainSc: 0-No pain      Patients Stated Pain Goal: 6 ([id number])  Complications: No apparent anesthesia complications

## 2019-06-06 NOTE — Progress Notes (Signed)
Patient had MRI liver done today to evaluate a possible liver mass found on follow up CT scan.  On MRI, patient is found to have a large mass in there right liver lobe involving segments V and VI, extending beyond the liver capsule inferiorly in the hepatic flexure area.  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  There is also evidence of cirrhosis, which was noted on previous imaging.  Given the findings, I have placed orders so patient can be discussed in multidisciplinary tumor board for appropriate treatment planning.  May need referral to tertiary center.  Olean Ree, MD

## 2019-06-07 ENCOUNTER — Telehealth: Payer: Self-pay

## 2019-06-07 ENCOUNTER — Other Ambulatory Visit: Payer: Self-pay

## 2019-06-07 DIAGNOSIS — R160 Hepatomegaly, not elsewhere classified: Secondary | ICD-10-CM

## 2019-06-07 DIAGNOSIS — R9389 Abnormal findings on diagnostic imaging of other specified body structures: Secondary | ICD-10-CM

## 2019-06-07 NOTE — Telephone Encounter (Signed)
The patient has been referred to [facility].  He has an appointment for consultation with Dr Shon Hough at the [facility], located at [place], [place] Appt: 06/14/19 at 12:00 PM Ph: [phone] The patient is aware of date and time.

## 2019-06-08 ENCOUNTER — Ambulatory Visit (HOSPITAL_COMMUNITY): Payer: Medicaid Other

## 2019-06-11 ENCOUNTER — Ambulatory Visit (HOSPITAL_COMMUNITY): Payer: Medicaid Other

## 2019-08-14 ENCOUNTER — Encounter: Payer: Self-pay | Admitting: *Deleted

## 2019-11-09 IMAGING — US US ABDOMEN LIMITED W/ ELASTOGRAPHY
2 series · 13 of 25 positions shown · non-contrast
Comparison: 09/10/2004

CLINICAL DATA: Hepatitis c

EXAM:
US ABDOMEN LIMITED - RIGHT UPPER QUADRANT
ULTRASOUND HEPATIC ELASTOGRAPHY
TECHNIQUE: Limited right upper quadrant abdominal ultrasound was performed. In
addition, ultrasound elastography evaluation of the liver was
performed. A region of interest was placed in the right lobe of the
liver. Following application of a compressive sonographic pulse,
shear waves were detected in the adjacent hepatic tissue and the
shear wave velocity was calculated. Multiple assessments were
performed at the selected site. Median shear wave velocity is
correlated to a Metavir fibrosis score.

[Series 1: us abdomen limited w/ elastography · 10 of 44 slices shown (1 of 2)]
[im 1/44]
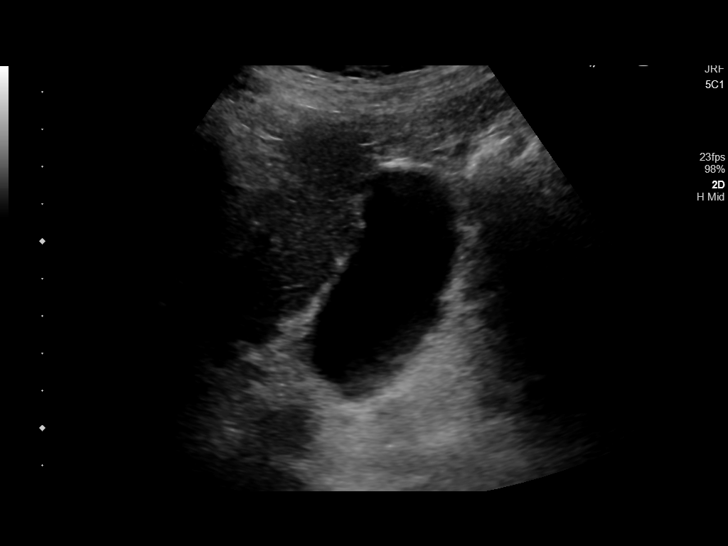
[im 5/44]
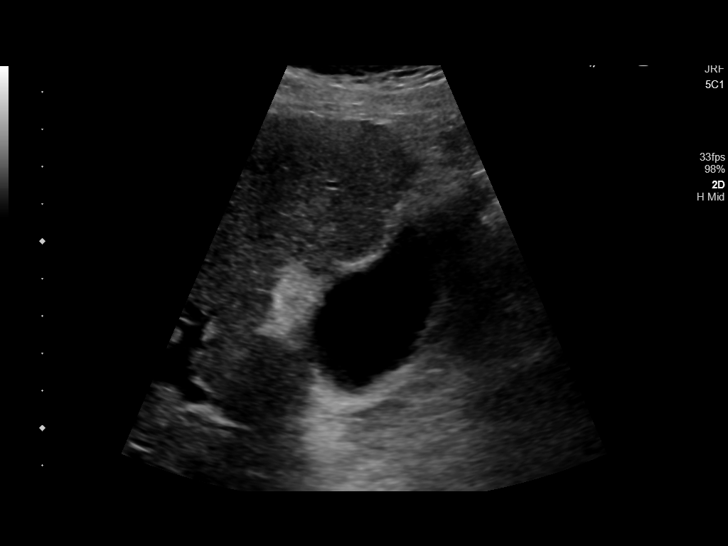
[im 10/44]
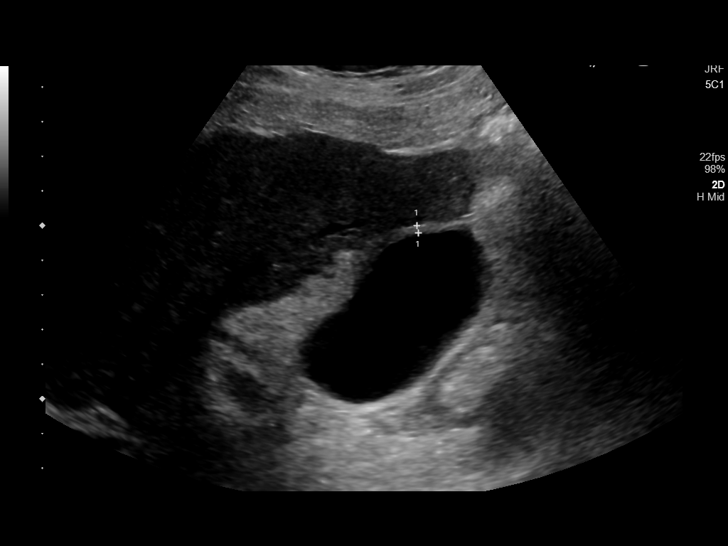
[im 14/44]
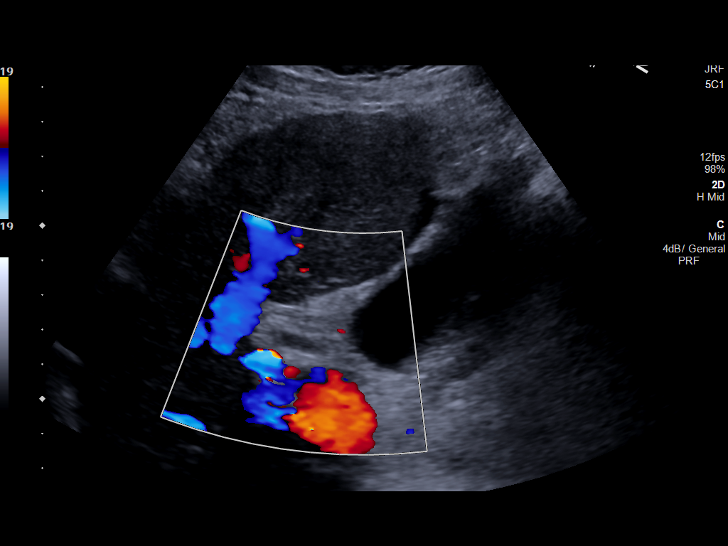
[im 19/44]
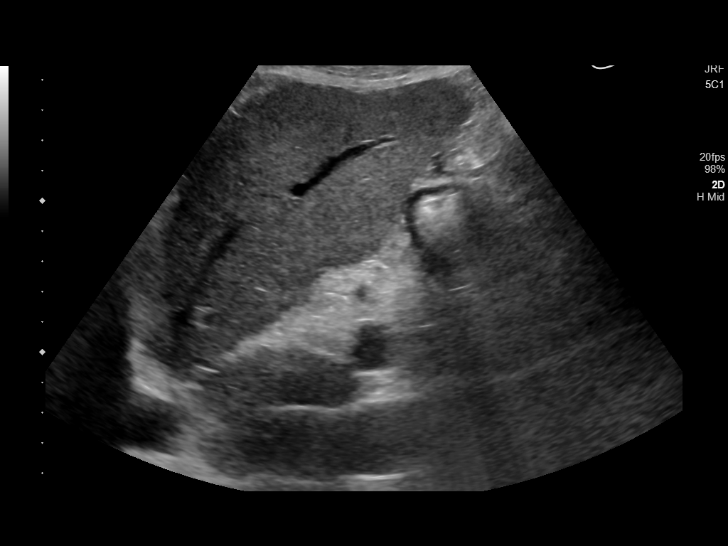
[im 23/44]
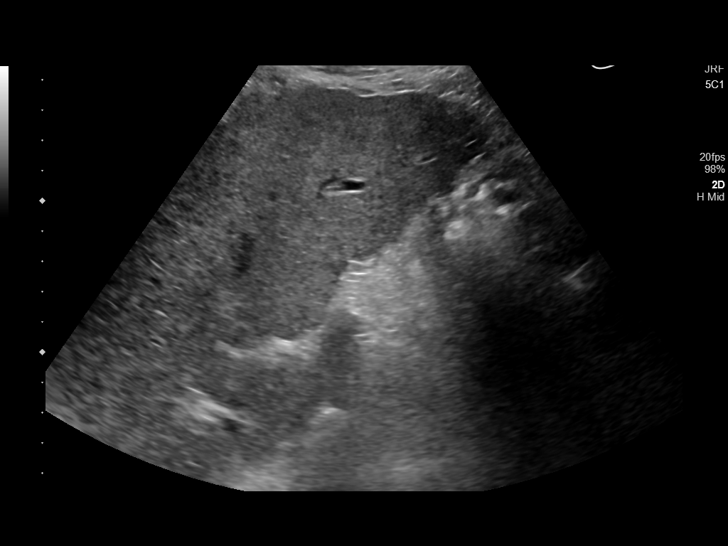
[im 28/44]
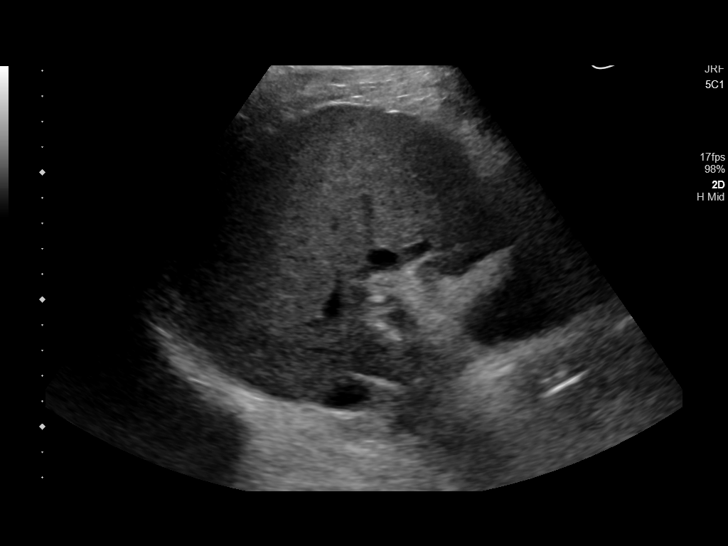
[im 32/44]
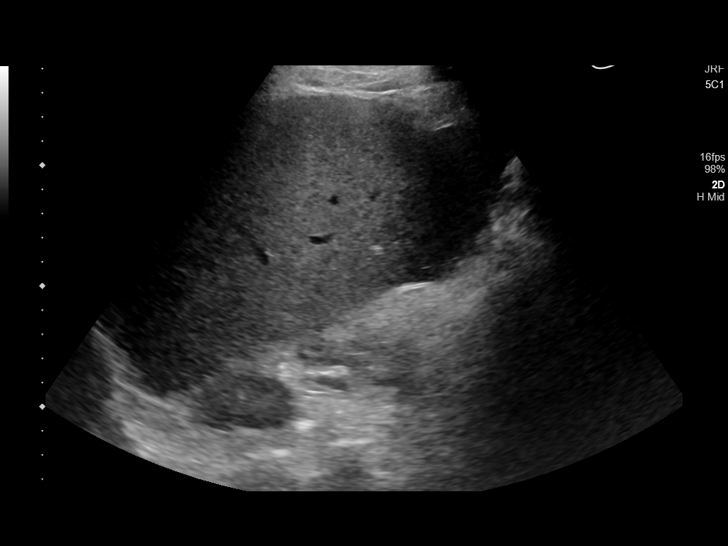
[im 37/44]
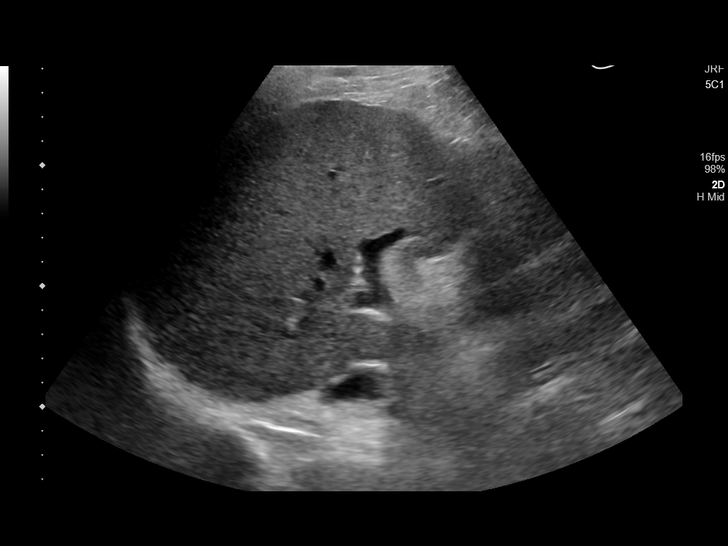
[im 41/44]
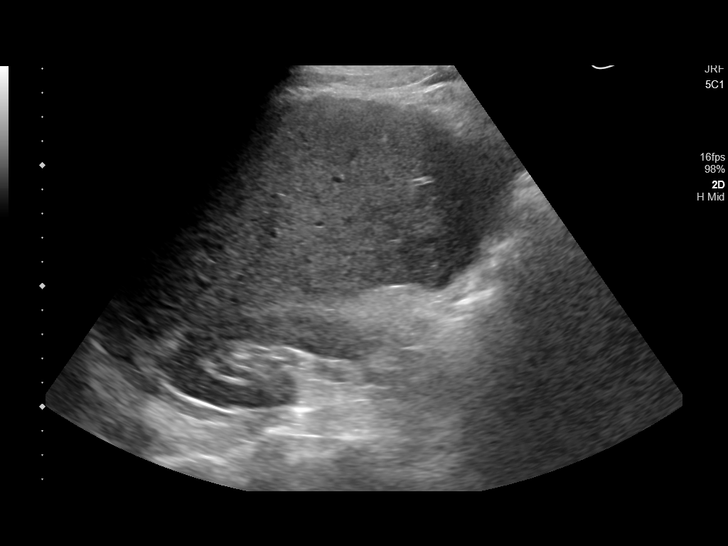

[Series 1001: us abdomen limited w/ elastography · 3 of 11 slices shown (2 of 2)]
[im 1/11]
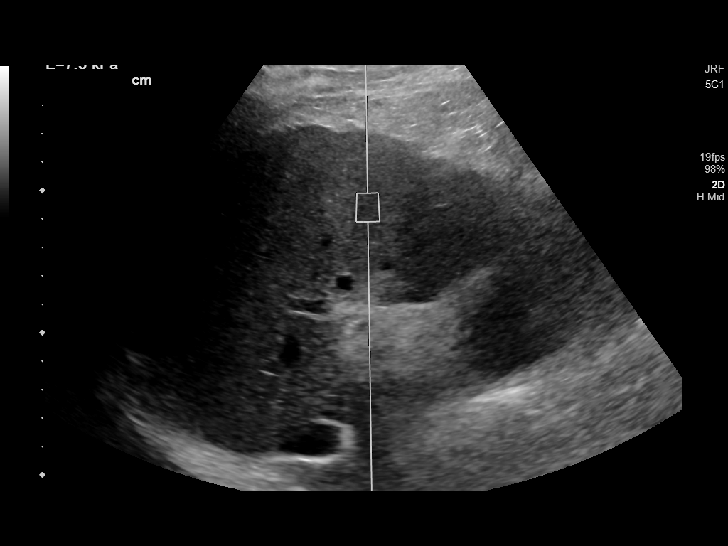
[im 6/11]
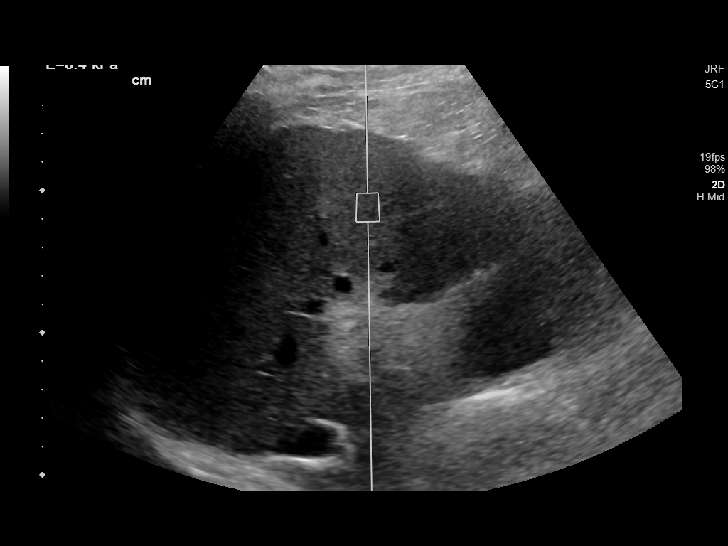
[im 11/11]
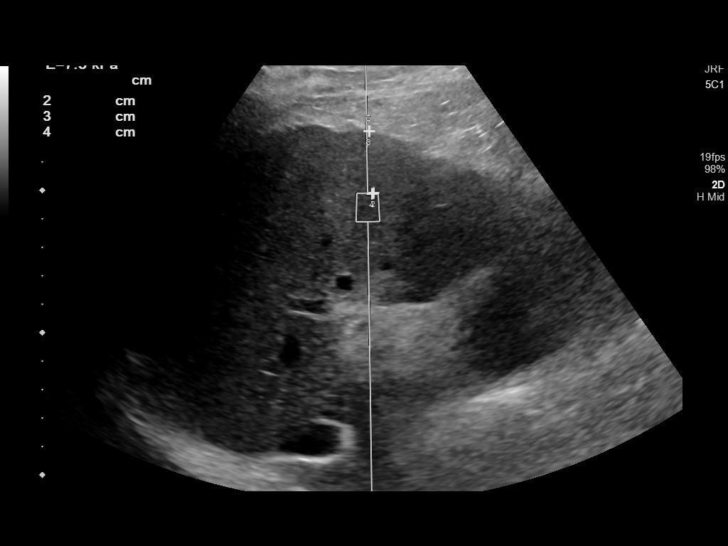

[13 of 25 positions shown; findings below may reference images not displayed]

FINDINGS: ULTRASOUND ABDOMEN LIMITED RIGHT UPPER QUADRANT

Gallbladder:

No gallstones or wall thickening visualized. No sonographic Murphy
sign noted.

Common bile duct:

Diameter: 0.4 cm

Liver:

No focal lesion identified. Nodular hepatic contour with mildly
accentuated echogenicity. Portal vein is patent on color Doppler
imaging with normal direction of blood flow towards the liver.

ULTRASOUND HEPATIC ELASTOGRAPHY

Device: Siemens Helix VTQ

Patient position: Supine

Transducer 5C1

Number of measurements: 10

Hepatic segment:  8

Median velocity:   1.73 m/sec

IQR:

IQR/Median velocity ratio:

Corresponding Metavir fibrosis score:  F2 + some F3

Risk of fibrosis: Moderate

Limitations of exam: None

Please note that abnormal shear wave velocities may also be
identified in clinical settings other than with hepatic fibrosis,
such as: acute hepatitis, elevated right heart and central venous
pressures including use of beta blockers, Boba disease
(Cruzdalys), infiltrative processes such as
mastocytosis/amyloidosis/infiltrative tumor, extrahepatic
cholestasis, in the post-prandial state, and liver transplantation.
Correlation with patient history, laboratory data, and clinical
condition recommended.
IMPRESSION: ULTRASOUND ABDOMEN:

1. Nodular hepatic contour with mildly accentuated echogenicity
compatible with cirrhosis. No focal hepatic mass identified.

ULTRASOUND HEPATIC ELASTOGRAHY:

Median hepatic shear wave velocity is calculated at 1.73 m/sec.

Corresponding Metavir fibrosis score is F2 + some F3.

Risk of fibrosis is moderate.

Follow-up: Additional testing appropriate

## 2019-12-31 ENCOUNTER — Other Ambulatory Visit: Payer: Self-pay | Admitting: Gastroenterology

## 2019-12-31 ENCOUNTER — Other Ambulatory Visit: Payer: Self-pay

## 2019-12-31 NOTE — Telephone Encounter (Signed)
Patient is requesting a refill on his Coreg. Do you refill this medication? Please advise.

## 2020-01-01 ENCOUNTER — Telehealth: Payer: Self-pay

## 2020-01-01 NOTE — Telephone Encounter (Signed)
Kirk Manifold, MD  You 56 minutes ago (10:36 AM)   I changed it to 30 days only, no refills. Pt has not been seen in a year. He should get further refills from PCP or follow up with [place] in clinic     Called patient but had to leave a detailed voicemail letting him know that he could get a refill on his Coreg for 30 days but that Dr. Bonna Gains would want for him to get them from his PCP or schedule an appointment with [place].

## 2020-01-29 ENCOUNTER — Other Ambulatory Visit: Payer: Self-pay | Admitting: Gastroenterology

## 2020-04-15 ENCOUNTER — Other Ambulatory Visit: Payer: Self-pay | Admitting: *Deleted

## 2020-04-15 ENCOUNTER — Ambulatory Visit
Admission: RE | Admit: 2020-04-15 | Discharge: 2020-04-15 | Disposition: A | Discharge status: Home / Self Care | Payer: Medicare Other | Source: Ambulatory Visit | Attending: Radiation Oncology | Admitting: Radiation Oncology

## 2020-04-15 ENCOUNTER — Ambulatory Visit
Admission: RE | Admit: 2020-04-15 | Discharge: 2020-04-15 | Disposition: A | Discharge status: Home / Self Care | Payer: Self-pay | Source: Ambulatory Visit | Attending: Radiation Oncology | Admitting: Radiation Oncology

## 2020-04-15 VITALS — Wt 191.0 lb

## 2020-04-15 DIAGNOSIS — C220 Liver cell carcinoma: Secondary | ICD-10-CM

## 2020-04-15 DIAGNOSIS — C7951 Secondary malignant neoplasm of bone: Secondary | ICD-10-CM

## 2020-04-15 NOTE — Consult Note (Signed)
NEW PATIENT EVALUATION  Name: [name]  MRN: [id number]  Date:   04/15/2020     DOB: Jul 11, 1955   This 65 y.o. male patient presents to the clinic for initial evaluation of bone metastasis to his right hip and patient with known.Hepatocellular Carcinoma, BCLC Stage C, Marcello Moores A    REFERRING PHYSICIAN: Gennette Pac, FNP  CHIEF COMPLAINT:  Chief Complaint  Patient presents with  . Consult    DIAGNOSIS: There were no encounter diagnoses.   PREVIOUS INVESTIGATIONS:  CT scans requested for my review Clinical notes reviewed Pathology report reviewed  HPI: Patient is a 65 year old male with knownHepatocellular Carcinoma, BCLC Stage C, Childs Pugh A.  His current treatments consist of a T0.  He is noted recently to have progression with new extrahepatic disease.  He has developed considerable pain in his right hip with recent CT scan showing a lytic lesion in the right iliac bone extending into the adjacent musculature measuring 5.7 x 3.3 cm.  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  Medical oncology at [facility] which is following the patient is considering options ofof lenvatinib vs supportive care.  He is now referred to [facility] for consideration of palliative treatment to his right hip.  Patient is ambulating well is currently on narcotic analgesics.      PLANNED TREATMENT REGIMEN: Palliative radiation therapy to right hip  PAST MEDICAL HISTORY:  has a past medical history of Anxiety, Cirrhosis ([place]), Diabetes mellitus without complication ([place]), GERD (gastroesophageal reflux disease), Hepatitis, High cholesterol, HLD (hyperlipidemia), and Hypertension.    PAST SURGICAL HISTORY:  Past Surgical History:  Procedure Laterality Date  . COLONOSCOPY WITH PROPOFOL N/A 10/03/2018   Procedure: COLONOSCOPY WITH PROPOFOL;  Surgeon: Virgel Manifold, MD;  Location: [facility]  ENDOSCOPY;  Service: Endoscopy;  Laterality: N/A;  . ELBOW SURGERY    . ESOPHAGOGASTRODUODENOSCOPY (EGD) WITH PROPOFOL N/A 10/03/2018   Procedure: ESOPHAGOGASTRODUODENOSCOPY (EGD) WITH PROPOFOL;  Surgeon: Virgel Manifold, MD;  Location: [facility] ENDOSCOPY;  Service: Endoscopy;  Laterality: N/A;  . EYE SURGERY Left    cataracts removed  . RADIOLOGY WITH ANESTHESIA N/A 06/06/2019   Procedure: MRI ADOMEN WITH AND WITHOUT CONTRAST;  Surgeon: Radiologist, Medication, MD;  Location: [place];  Service: Radiology;  Laterality: N/A;    FAMILY HISTORY: family history is not on file.  SOCIAL HISTORY:  reports that he has been smoking cigarettes. He has a 25.00 pack-year smoking history. He has never used smokeless tobacco. He reports current alcohol use of about 7.0 standard drinks of alcohol per week. He reports that he does not use drugs.  ALLERGIES: Patient has no known allergies.  MEDICATIONS:  Current Outpatient Medications  Medication Sig Dispense Refill  . amLODipine (NORVASC) 5 MG tablet Take 5 mg by mouth daily.    Marland Kitchen atorvastatin (LIPITOR) 40 MG tablet Take 40 mg by mouth at bedtime.     . carvedilol (COREG) 6.25 MG tablet TAKE 1 TABLET BY MOUTH EVERY DAY 30 tablet 0  . citalopram (CELEXA) 10 MG tablet Take 1 tablet by mouth daily.    Marland Kitchen glipiZIDE (GLUCOTROL) 10 MG tablet Take 10 mg by mouth daily before breakfast.    . lisinopril (ZESTRIL) 20 MG tablet Take 20 mg by mouth daily.    . metFORMIN (GLUCOPHAGE) 1000 MG tablet Take 1,000 mg by mouth 2 (two) times daily with a meal.    .  oxyCODONE (OXY IR/ROXICODONE) 5 MG immediate release tablet Take 5 mg by mouth every 6 (six) hours.    Marland Kitchen loperamide (IMODIUM A-D) 2 MG tablet Take 1 tablet (2 mg total) by mouth 4 (four) times daily as needed for diarrhea or loose stools. (Patient not taking: No sig reported) 30 tablet 0   No current facility-administered medications for this encounter.    ECOG PERFORMANCE STATUS:  1 - Symptomatic but completely  ambulatory  REVIEW OF SYSTEMS: Patient denies any weight loss, fatigue, weakness, fever, chills or night sweats. Patient denies any loss of vision, blurred vision. Patient denies any ringing  of the ears or hearing loss. No irregular heartbeat. Patient denies heart murmur or history of fainting. Patient denies any chest pain or pain radiating to her upper extremities. Patient denies any shortness of breath, difficulty breathing at night, cough or hemoptysis. Patient denies any swelling in the lower legs. Patient denies any nausea vomiting, vomiting of blood, or coffee ground material in the vomitus. Patient denies any stomach pain. Patient states has had normal bowel movements no significant constipation or diarrhea. Patient denies any dysuria, hematuria or significant nocturia. Patient denies any problems walking, swelling in the joints or loss of balance. Patient denies any skin changes, loss of hair or loss of weight. Patient denies any excessive worrying or anxiety or significant depression. Patient denies any problems with insomnia. Patient denies excessive thirst, polyuria, polydipsia. Patient denies any swollen glands, patient denies easy bruising or easy bleeding. Patient denies any recent infections, allergies or URI. Patient "s visual fields have not changed significantly in recent time.   PHYSICAL EXAM: Wt 191 lb (86.6 kg)   BMI 27.41 kg/m  Range of motion of his right lower extremity does not elicit pain.  Motor or sensory and DTR levels are equal and symmetric in the lower extremities.  Well-developed well-nourished patient in NAD. HEENT reveals PERLA, EOMI, discs not visualized.  Oral cavity is clear. No oral mucosal lesions are identified. Neck is clear without evidence of cervical or supraclavicular adenopathy. Lungs are clear to A&P. Cardiac examination is essentially unremarkable with regular rate and rhythm without murmur rub or thrill. Abdomen is benign with no organomegaly or masses  noted. Motor sensory and DTR levels are equal and symmetric in the upper and lower extremities. Cranial nerves II through XII are grossly intact. Proprioception is intact. No peripheral adenopathy or edema is identified. No motor or sensory levels are noted. Crude visual fields are within normal range.  LABORATORY DATA: Pathology report reviewed    RADIOLOGY RESULTS: CT scans requested for my review   IMPRESSION: Stage IV hepatocellular carcinoma with metastatic disease to his right hip in 65 year old male  PLAN: This time I requested his films from [facility] for my interpretation and for treatment planning purposes.  Would plan on delivering 30 Gray in 10 fractions based on the fact this is a weightbearing area.  Risks and benefits of treatment including possible fatigue skin reaction all were discussed in detail with the patient.  He comprehends my treatment plan well.  I have set him up for CT simulation first thing next week giving [place] a few days to retrieve his films from [facility].  I would like to take this opportunity to thank you for allowing me to participate in the care of your patient.Noreene Filbert, MD

## 2020-04-17 ENCOUNTER — Other Ambulatory Visit: Payer: Self-pay | Admitting: Licensed Clinical Social Worker

## 2020-04-17 ENCOUNTER — Ambulatory Visit
Admission: RE | Admit: 2020-04-17 | Discharge: 2020-04-17 | Disposition: A | Discharge status: Home / Self Care | Payer: Self-pay | Source: Ambulatory Visit | Attending: Radiation Oncology | Admitting: Radiation Oncology

## 2020-04-17 DIAGNOSIS — C7951 Secondary malignant neoplasm of bone: Secondary | ICD-10-CM

## 2020-04-20 ENCOUNTER — Ambulatory Visit
Admission: RE | Admit: 2020-04-20 | Discharge: 2020-04-20 | Disposition: A | Discharge status: Home / Self Care | Payer: Medicare Other | Source: Ambulatory Visit | Attending: Radiation Oncology | Admitting: Radiation Oncology

## 2020-04-20 DIAGNOSIS — C7951 Secondary malignant neoplasm of bone: Secondary | ICD-10-CM | POA: Insufficient documentation

## 2020-04-20 DIAGNOSIS — Z510 Encounter for antineoplastic radiation therapy: Secondary | ICD-10-CM | POA: Diagnosis not present

## 2020-04-20 DIAGNOSIS — C220 Liver cell carcinoma: Secondary | ICD-10-CM | POA: Insufficient documentation

## 2020-04-21 DIAGNOSIS — Z510 Encounter for antineoplastic radiation therapy: Secondary | ICD-10-CM | POA: Diagnosis not present

## 2020-04-23 ENCOUNTER — Ambulatory Visit: Admission: RE | Admit: 2020-04-23 | Payer: Medicare Other | Source: Ambulatory Visit

## 2020-04-23 DIAGNOSIS — Z510 Encounter for antineoplastic radiation therapy: Secondary | ICD-10-CM | POA: Diagnosis not present

## 2020-04-27 ENCOUNTER — Ambulatory Visit
Admission: RE | Admit: 2020-04-27 | Discharge: 2020-04-27 | Disposition: A | Discharge status: Home / Self Care | Payer: Medicare Other | Source: Ambulatory Visit | Attending: Radiation Oncology | Admitting: Radiation Oncology

## 2020-04-27 DIAGNOSIS — Z510 Encounter for antineoplastic radiation therapy: Secondary | ICD-10-CM | POA: Diagnosis not present

## 2020-04-28 ENCOUNTER — Ambulatory Visit
Admission: RE | Admit: 2020-04-28 | Discharge: 2020-04-28 | Disposition: A | Discharge status: Home / Self Care | Payer: Medicare Other | Source: Ambulatory Visit | Attending: Radiation Oncology | Admitting: Radiation Oncology

## 2020-04-28 DIAGNOSIS — Z510 Encounter for antineoplastic radiation therapy: Secondary | ICD-10-CM | POA: Diagnosis not present

## 2020-04-29 ENCOUNTER — Ambulatory Visit
Admission: RE | Admit: 2020-04-29 | Discharge: 2020-04-29 | Disposition: A | Discharge status: Home / Self Care | Payer: Medicare Other | Source: Ambulatory Visit | Attending: Radiation Oncology | Admitting: Radiation Oncology

## 2020-04-29 DIAGNOSIS — Z510 Encounter for antineoplastic radiation therapy: Secondary | ICD-10-CM | POA: Diagnosis not present

## 2020-04-30 ENCOUNTER — Ambulatory Visit
Admission: RE | Admit: 2020-04-30 | Discharge: 2020-04-30 | Disposition: A | Discharge status: Home / Self Care | Payer: Medicare Other | Source: Ambulatory Visit | Attending: Radiation Oncology | Admitting: Radiation Oncology

## 2020-04-30 DIAGNOSIS — Z510 Encounter for antineoplastic radiation therapy: Secondary | ICD-10-CM | POA: Diagnosis not present

## 2020-05-01 ENCOUNTER — Ambulatory Visit
Admission: RE | Admit: 2020-05-01 | Discharge: 2020-05-01 | Disposition: A | Discharge status: Home / Self Care | Payer: Medicare Other | Source: Ambulatory Visit | Attending: Radiation Oncology | Admitting: Radiation Oncology

## 2020-05-01 DIAGNOSIS — Z510 Encounter for antineoplastic radiation therapy: Secondary | ICD-10-CM | POA: Diagnosis present

## 2020-05-01 DIAGNOSIS — C220 Liver cell carcinoma: Secondary | ICD-10-CM | POA: Diagnosis not present

## 2020-05-01 DIAGNOSIS — C7951 Secondary malignant neoplasm of bone: Secondary | ICD-10-CM | POA: Diagnosis present

## 2020-05-04 ENCOUNTER — Ambulatory Visit
Admission: RE | Admit: 2020-05-04 | Discharge: 2020-05-04 | Disposition: A | Discharge status: Home / Self Care | Payer: Medicare Other | Source: Ambulatory Visit | Attending: Radiation Oncology | Admitting: Radiation Oncology

## 2020-05-04 DIAGNOSIS — Z510 Encounter for antineoplastic radiation therapy: Secondary | ICD-10-CM | POA: Diagnosis not present

## 2020-05-05 ENCOUNTER — Ambulatory Visit
Admission: RE | Admit: 2020-05-05 | Discharge: 2020-05-05 | Disposition: A | Discharge status: Home / Self Care | Payer: Medicare Other | Source: Ambulatory Visit | Attending: Radiation Oncology | Admitting: Radiation Oncology

## 2020-05-05 DIAGNOSIS — Z510 Encounter for antineoplastic radiation therapy: Secondary | ICD-10-CM | POA: Diagnosis not present

## 2020-05-06 ENCOUNTER — Ambulatory Visit
Admission: RE | Admit: 2020-05-06 | Discharge: 2020-05-06 | Disposition: A | Discharge status: Home / Self Care | Payer: Medicare Other | Source: Ambulatory Visit | Attending: Radiation Oncology | Admitting: Radiation Oncology

## 2020-05-06 DIAGNOSIS — Z510 Encounter for antineoplastic radiation therapy: Secondary | ICD-10-CM | POA: Diagnosis not present

## 2020-05-07 ENCOUNTER — Ambulatory Visit
Admission: RE | Admit: 2020-05-07 | Discharge: 2020-05-07 | Disposition: A | Discharge status: Home / Self Care | Payer: Medicare Other | Source: Ambulatory Visit | Attending: Radiation Oncology | Admitting: Radiation Oncology

## 2020-05-07 DIAGNOSIS — Z510 Encounter for antineoplastic radiation therapy: Secondary | ICD-10-CM | POA: Diagnosis not present

## 2020-05-08 ENCOUNTER — Ambulatory Visit
Admission: RE | Admit: 2020-05-08 | Discharge: 2020-05-08 | Disposition: A | Discharge status: Home / Self Care | Payer: Medicare Other | Source: Ambulatory Visit | Attending: Radiation Oncology | Admitting: Radiation Oncology

## 2020-05-08 DIAGNOSIS — Z510 Encounter for antineoplastic radiation therapy: Secondary | ICD-10-CM | POA: Diagnosis not present

## 2020-06-10 ENCOUNTER — Other Ambulatory Visit: Payer: Self-pay

## 2020-06-10 ENCOUNTER — Ambulatory Visit
Admission: RE | Admit: 2020-06-10 | Discharge: 2020-06-10 | Disposition: A | Discharge status: Home / Self Care | Source: Ambulatory Visit | Attending: Radiation Oncology | Admitting: Radiation Oncology

## 2020-06-10 VITALS — BP 128/72 | HR 61 | Temp 96.0°F | Wt 182.3 lb

## 2020-06-10 DIAGNOSIS — Z923 Personal history of irradiation: Secondary | ICD-10-CM | POA: Diagnosis not present

## 2020-06-10 DIAGNOSIS — C7951 Secondary malignant neoplasm of bone: Secondary | ICD-10-CM | POA: Diagnosis not present

## 2020-06-10 DIAGNOSIS — C220 Liver cell carcinoma: Secondary | ICD-10-CM | POA: Diagnosis not present

## 2020-06-10 NOTE — Progress Notes (Signed)
Radiation Oncology Follow up Note  Name: [name]   Date:   06/10/2020 MRN:  [id number] DOB: [birth year]    This 65 y.o. male presents to the clinic today for 1 month follow-up status post palliative radiation therapy to his right hip and patient with known stage IV hepatocellular carcinoma.  REFERRING PROVIDER: Gennette Pac, FNP  HPI: Patient is a 65 year old male now at 1 month having completed palliative radiation therapy to his right hip for stage IV hepatocellular carcinoma.  Seen today in routine follow-up he is doing well.  He states he has had complete resolution of pain in his left hip and is ambulating well.  He continues to have pain in his right upper quadrant consistent with his known hepatocellular carcinoma.  He is currently under hospice care..  COMPLICATIONS OF TREATMENT: none  FOLLOW UP COMPLIANCE: keeps appointments   PHYSICAL EXAM:  BP 128/72 (BP Location: Left Arm, Patient Position: Sitting)   Pulse 61   Temp (!) 96 F (35.6 C) (Tympanic)   Wt 182 lb 4.8 oz (82.7 kg)   BMI 26.16 kg/m  Range of motion of his lower extremities does not elicit pain.  Motor or sensory in detail levels are equal and symmetric in the lower extremities.  Well-developed well-nourished patient in NAD. HEENT reveals PERLA, EOMI, discs not visualized.  Oral cavity is clear. No oral mucosal lesions are identified. Neck is clear without evidence of cervical or supraclavicular adenopathy. Lungs are clear to A&P. Cardiac examination is essentially unremarkable with regular rate and rhythm without murmur rub or thrill. Abdomen is benign with no organomegaly or masses noted. Motor sensory and DTR levels are equal and symmetric in the upper and lower extremities. Cranial nerves II through XII are grossly intact. Proprioception is intact. No peripheral adenopathy or edema is identified. No motor or sensory levels are noted. Crude visual fields are within normal range.  RADIOLOGY RESULTS: No  current films for review  PLAN: Present time patient is achieved excellent palliation to his hip from palliative radiation.  And pleased with his overall progress.  I will turn follow-up care over to the hospice people.  I would be happy to reevaluate the patient anytime in the future should palliative treatment be indicated.  I would like to take this opportunity to thank you for allowing me to participate in the care of your patient.Noreene Filbert, MD

## 2020-09-17 ENCOUNTER — Ambulatory Visit: Payer: Medicare Other | Admitting: Radiation Oncology

## 2020-12-01 DEATH — deceased
